# Patient Record
Sex: Female | Born: 1985 | Race: Black or African American | Hispanic: No | Marital: Married | Smoking: Current every day smoker
Health system: Southern US, Community
[De-identification: ages and names within clinical notes are randomized; demographics above are authoritative.]

## PROBLEM LIST (undated history)

## (undated) DIAGNOSIS — R0789 Other chest pain: Secondary | ICD-10-CM

## (undated) DIAGNOSIS — M545 Low back pain, unspecified: Secondary | ICD-10-CM

## (undated) DIAGNOSIS — F1210 Cannabis abuse, uncomplicated: Secondary | ICD-10-CM

## (undated) DIAGNOSIS — M5430 Sciatica, unspecified side: Secondary | ICD-10-CM

## (undated) DIAGNOSIS — E079 Disorder of thyroid, unspecified: Secondary | ICD-10-CM

## (undated) DIAGNOSIS — G8929 Other chronic pain: Secondary | ICD-10-CM

## (undated) DIAGNOSIS — A048 Other specified bacterial intestinal infections: Secondary | ICD-10-CM

## (undated) HISTORY — PX: HERNIA REPAIR: SHX51

## (undated) HISTORY — PX: THYROIDECTOMY, PARTIAL: SHX18

---

## 2016-03-23 ENCOUNTER — Emergency Department (HOSPITAL_BASED_OUTPATIENT_CLINIC_OR_DEPARTMENT_OTHER)
Admission: EM | Admit: 2016-03-23 | Discharge: 2016-03-23 | Disposition: A | Discharge status: Home / Self Care | Payer: Medicaid Other | Attending: Physician Assistant | Admitting: Physician Assistant

## 2016-03-23 ENCOUNTER — Encounter (HOSPITAL_BASED_OUTPATIENT_CLINIC_OR_DEPARTMENT_OTHER): Payer: Self-pay | Admitting: Emergency Medicine

## 2016-03-23 ENCOUNTER — Emergency Department (HOSPITAL_BASED_OUTPATIENT_CLINIC_OR_DEPARTMENT_OTHER): Payer: Medicaid Other

## 2016-03-23 DIAGNOSIS — R519 Headache, unspecified: Secondary | ICD-10-CM

## 2016-03-23 DIAGNOSIS — R51 Headache: Secondary | ICD-10-CM | POA: Diagnosis not present

## 2016-03-23 HISTORY — DX: Disorder of thyroid, unspecified: E07.9

## 2016-03-23 LAB — CBC WITH DIFFERENTIAL/PLATELET
BASOS ABS: 0 10*3/uL (ref 0.0–0.1)
BASOS PCT: 0 %
EOS ABS: 0 10*3/uL (ref 0.0–0.7)
EOS PCT: 0 %
HCT: 37.1 % (ref 36.0–46.0)
Hemoglobin: 12.9 g/dL (ref 12.0–15.0)
Lymphocytes Relative: 24 %
Lymphs Abs: 1.1 10*3/uL (ref 0.7–4.0)
MCH: 31.2 pg (ref 26.0–34.0)
MCHC: 34.8 g/dL (ref 30.0–36.0)
MCV: 89.8 fL (ref 78.0–100.0)
MONO ABS: 0.5 10*3/uL (ref 0.1–1.0)
Monocytes Relative: 11 %
Neutro Abs: 3.1 10*3/uL (ref 1.7–7.7)
Neutrophils Relative %: 65 %
PLATELETS: 159 10*3/uL (ref 150–400)
RBC: 4.13 MIL/uL (ref 3.87–5.11)
RDW: 12.4 % (ref 11.5–15.5)
WBC: 4.8 10*3/uL (ref 4.0–10.5)

## 2016-03-23 LAB — COMPREHENSIVE METABOLIC PANEL
ALBUMIN: 4.6 g/dL (ref 3.5–5.0)
ALK PHOS: 56 U/L (ref 38–126)
ALT: 15 U/L (ref 14–54)
ANION GAP: 9 (ref 5–15)
AST: 26 U/L (ref 15–41)
BILIRUBIN TOTAL: 0.6 mg/dL (ref 0.3–1.2)
BUN: 8 mg/dL (ref 6–20)
CALCIUM: 9.5 mg/dL (ref 8.9–10.3)
CO2: 26 mmol/L (ref 22–32)
CREATININE: 0.66 mg/dL (ref 0.44–1.00)
Chloride: 102 mmol/L (ref 101–111)
GFR calc Af Amer: >60 mL/min (ref >60)
GFR calc non Af Amer: >60 mL/min (ref >60)
GLUCOSE: 120 mg/dL — AB (ref 65–99)
Potassium: 3.2 mmol/L — ABNORMAL LOW (ref 3.5–5.1)
Sodium: 137 mmol/L (ref 135–145)
TOTAL PROTEIN: 8 g/dL (ref 6.5–8.1)

## 2016-03-23 LAB — PREGNANCY, URINE: Preg Test, Ur: NEGATIVE

## 2016-03-23 MED ORDER — PROCHLORPERAZINE EDISYLATE 5 MG/ML IJ SOLN
10.0000 mg | Freq: Once | INTRAMUSCULAR | Status: AC
  Administered 2016-03-23: 10 mg via INTRAVENOUS
  Filled 2016-03-23: qty 2

## 2016-03-23 MED ORDER — DIPHENHYDRAMINE HCL 50 MG/ML IJ SOLN
25.0000 mg | Freq: Once | INTRAMUSCULAR | Status: AC
  Administered 2016-03-23: 25 mg via INTRAVENOUS
  Filled 2016-03-23: qty 1

## 2016-03-23 MED ORDER — SODIUM CHLORIDE 0.9 % IV BOLUS (SEPSIS)
1000.0000 mL | Freq: Once | INTRAVENOUS | Status: AC
  Administered 2016-03-23: 1000 mL via INTRAVENOUS

## 2016-03-23 NOTE — ED Notes (Signed)
Pt told emt she had to leave,  rn went into room and pt was dressed and had removed iv,  Holding pressure at site, bleeding controlled,  rn applied dressing,  Pt stated pain was a 1 and thanked rn for the care she received  Would not wait to get vs

## 2016-03-23 NOTE — ED Notes (Signed)
Patient transported to CT 

## 2016-03-23 NOTE — ED Provider Notes (Signed)
[facility]   CSN: [id number] Arrival date & time: 03/23/16  1627     History   Chief Complaint Chief Complaint  Patient presents with  . Headache    HPI Kelli Bowman is a 30 y.o. female.  HPI   She is a 30 year old female presenting with headache. Patient's headache started around 1 PM today she ate. She said it started with a mild headache she went to sleep and she could not get rid of it. She said she took Tylenol and did not get better. She's got no neurologic symptoms. No nausea vomiting diarrhea photophobia. Has never had migraines in the past.  It sounds like it was not the most intense in the beginning but rather got increasingly intense. However it does feel worse than any headache she has had in the past.   Past Medical History:  Diagnosis Date  . Thyroid disease     There are no active problems to display for this patient.   No past surgical history on file.  OB History    No data available       Home Medications    Prior to Admission medications   Not on File    Family History No family history on file.  Social History Social History  Substance Use Topics  . Smoking status: Not on file  . Smokeless tobacco: Not on file  . Alcohol use Not on file     Allergies   Vicodin [hydrocodone-acetaminophen]   Review of Systems Review of Systems  Constitutional: Negative for fatigue and fever.  Gastrointestinal: Negative for nausea and vomiting.  Neurological: Positive for headaches.  All other systems reviewed and are negative.    Physical Exam Updated Vital Signs BP 156/82   Pulse 74   Temp 98.4 F (36.9 C) (Oral)   Resp 22   Ht 5\' 6"  (1.676 m)   Wt 105 lb 2 oz (47.7 kg)   LMP 03/02/2016   SpO2 100%   BMI 16.97 kg/m   Physical Exam  Constitutional: She is oriented to person, place, and time. She appears well-developed and well-nourished.  HENT:  Head: Normocephalic and atraumatic.  Eyes: Right eye  exhibits no discharge.  Cardiovascular: Normal rate.   Pulmonary/Chest: Effort normal.  Neurological: She is oriented to person, place, and time. No cranial nerve deficit.  Cranial nerves or motor. Patient withmoving all 4 extremitis.Marland Kitchen. Appears neurologically baseline. Pupils equal round responsive to light.  Skin: Skin is warm and dry. She is not diaphoretic.  Psychiatric: She has a normal mood and affect.  Nursing note and vitals reviewed.    ED Treatments / Results  Labs (all labs ordered are listed, but only abnormal results are displayed) Labs Reviewed  COMPREHENSIVE METABOLIC PANEL - Abnormal; Notable for the following:       Result Value   Potassium 3.2 (*)    Glucose, Bld 120 (*)    All other components within normal limits  CBC WITH DIFFERENTIAL/PLATELET  PREGNANCY, URINE    EKG  EKG Interpretation None       Radiology Ct Head Wo Contrast  Result Date: 03/23/2016 CLINICAL DATA:  Severe headaches EXAM: CT HEAD WITHOUT CONTRAST TECHNIQUE: Contiguous axial images were obtained from the base of the skull through the vertex without intravenous contrast. COMPARISON:  None. FINDINGS: Brain: No evidence of acute infarction, hemorrhage, hydrocephalus, extra-axial collection or mass lesion/mass effect. Vascular: No hyperdense vessel or unexpected calcification. Skull: Normal. Negative for fracture or focal  lesion. Sinuses/Orbits: No acute finding. Other: None. IMPRESSION: No acute abnormality noted. Electronically Signed   By: Alcide Clever M.D.   On: 03/23/2016 17:47    Procedures Procedures (including critical care time)  Medications Ordered in ED Medications  sodium chloride 0.9 % bolus 1,000 mL (0 mLs Intravenous Stopped 03/23/16 1755)  prochlorperazine (COMPAZINE) injection 10 mg (10 mg Intravenous Given 03/23/16 1713)  diphenhydrAMINE (BENADRYL) injection 25 mg (25 mg Intravenous Given 03/23/16 1716)     Initial Impression / Assessment and Plan / ED Course  I have  reviewed the triage vital signs and the nursing notes.  Pertinent labs & imaging results that were available during my care of the patient were reviewed by me and considered in my medical decision making (see chart for details).  Clinical Course   She is a healthy 30 year old \\presenting  with headache. Patient states that she does not usually get bad headaches. She took Tylenol and did not get better so she is here for help. We will get a CT. Given the fact that this headache started around 1 PM I think a CT noncontrast read by the radiologist will be sufficient to rule out subarachnoid hemorrhage. (Based on Perrry's data/publications) and ACEP guidelines for headache.  Will treat as migraine. Patient has no neurological deficit. Do not suspect cerebral venous  given that she has no risk factors, not on estrogens, no pregnancy.  CT negative.  Paitent felt imrpoved after treatment, then took out IV and left.   Final Clinical Impressions(s) / ED Diagnoses   Final diagnoses:  None    New Prescriptions There are no discharge medications for this patient.    Kelli Randall An, MD 03/23/16 [phone]

## 2016-03-23 NOTE — ED Triage Notes (Signed)
Onset of headache at 1300 today   Denies nausea opr vomiting

## 2016-03-23 NOTE — ED Notes (Signed)
Pt requesting something to eat. Pt instructed that all tests had to be evaluated before she could have anything.

## 2016-10-03 ENCOUNTER — Encounter (HOSPITAL_BASED_OUTPATIENT_CLINIC_OR_DEPARTMENT_OTHER): Payer: Self-pay | Admitting: *Deleted

## 2016-10-03 DIAGNOSIS — M542 Cervicalgia: Secondary | ICD-10-CM | POA: Diagnosis present

## 2016-10-03 DIAGNOSIS — R1013 Epigastric pain: Secondary | ICD-10-CM | POA: Diagnosis not present

## 2016-10-03 DIAGNOSIS — R197 Diarrhea, unspecified: Secondary | ICD-10-CM | POA: Diagnosis not present

## 2016-10-03 DIAGNOSIS — M62838 Other muscle spasm: Secondary | ICD-10-CM | POA: Diagnosis not present

## 2016-10-03 DIAGNOSIS — R1011 Right upper quadrant pain: Secondary | ICD-10-CM | POA: Diagnosis not present

## 2016-10-03 DIAGNOSIS — F17200 Nicotine dependence, unspecified, uncomplicated: Secondary | ICD-10-CM | POA: Insufficient documentation

## 2016-10-03 LAB — BASIC METABOLIC PANEL
Anion gap: 8 (ref 5–15)
BUN: 9 mg/dL (ref 6–20)
CALCIUM: 9 mg/dL (ref 8.9–10.3)
CO2: 29 mmol/L (ref 22–32)
CREATININE: 0.75 mg/dL (ref 0.44–1.00)
Chloride: 101 mmol/L (ref 101–111)
GFR calc Af Amer: >60 mL/min (ref >60)
Glucose, Bld: 89 mg/dL (ref 65–99)
Potassium: 3.3 mmol/L — ABNORMAL LOW (ref 3.5–5.1)
SODIUM: 138 mmol/L (ref 135–145)

## 2016-10-03 LAB — CBC WITH DIFFERENTIAL/PLATELET
Basophils Absolute: 0 10*3/uL (ref 0.0–0.1)
Basophils Relative: 0 %
EOS ABS: 0.1 10*3/uL (ref 0.0–0.7)
EOS PCT: 1 %
HCT: 35.8 % — ABNORMAL LOW (ref 36.0–46.0)
Hemoglobin: 11.9 g/dL — ABNORMAL LOW (ref 12.0–15.0)
LYMPHS PCT: 57 %
Lymphs Abs: 2.8 10*3/uL (ref 0.7–4.0)
MCH: 31.2 pg (ref 26.0–34.0)
MCHC: 33.2 g/dL (ref 30.0–36.0)
MCV: 94 fL (ref 78.0–100.0)
MONO ABS: 0.6 10*3/uL (ref 0.1–1.0)
Monocytes Relative: 12 %
Neutro Abs: 1.4 10*3/uL — ABNORMAL LOW (ref 1.7–7.7)
Neutrophils Relative %: 30 %
PLATELETS: 161 10*3/uL (ref 150–400)
RBC: 3.81 MIL/uL — AB (ref 3.87–5.11)
RDW: 12.6 % (ref 11.5–15.5)
WBC: 4.9 10*3/uL (ref 4.0–10.5)

## 2016-10-03 LAB — URINALYSIS, MICROSCOPIC (REFLEX)

## 2016-10-03 LAB — URINALYSIS, ROUTINE W REFLEX MICROSCOPIC
BILIRUBIN URINE: NEGATIVE
GLUCOSE, UA: NEGATIVE mg/dL
HGB URINE DIPSTICK: NEGATIVE
KETONES UR: NEGATIVE mg/dL
Nitrite: POSITIVE — AB
PH: 6 (ref 5.0–8.0)
Protein, ur: NEGATIVE mg/dL
Specific Gravity, Urine: 1.025 (ref 1.005–1.030)

## 2016-10-03 LAB — PREGNANCY, URINE: Preg Test, Ur: NEGATIVE

## 2016-10-03 LAB — I-STAT CG4 LACTIC ACID, ED: Lactic Acid, Venous: 0.91 mmol/L (ref 0.5–1.9)

## 2016-10-03 NOTE — ED Triage Notes (Signed)
Abdominal pain after she eats. Started 2 days ago. States she is having muscle spasms in the right side of her neck. Hurts with certain movements.

## 2016-10-04 ENCOUNTER — Emergency Department (HOSPITAL_BASED_OUTPATIENT_CLINIC_OR_DEPARTMENT_OTHER): Payer: Medicaid Other

## 2016-10-04 ENCOUNTER — Emergency Department (HOSPITAL_BASED_OUTPATIENT_CLINIC_OR_DEPARTMENT_OTHER)
Admission: EM | Admit: 2016-10-04 | Discharge: 2016-10-04 | Disposition: A | Discharge status: Home / Self Care | Payer: Medicaid Other | Attending: Emergency Medicine | Admitting: Emergency Medicine

## 2016-10-04 ENCOUNTER — Encounter (HOSPITAL_BASED_OUTPATIENT_CLINIC_OR_DEPARTMENT_OTHER): Payer: Self-pay

## 2016-10-04 ENCOUNTER — Emergency Department (HOSPITAL_BASED_OUTPATIENT_CLINIC_OR_DEPARTMENT_OTHER)
Admission: EM | Admit: 2016-10-04 | Discharge: 2016-10-04 | Disposition: A | Discharge status: Home / Self Care | Payer: Medicaid Other | Source: Home / Self Care | Attending: Emergency Medicine | Admitting: Emergency Medicine

## 2016-10-04 DIAGNOSIS — M62838 Other muscle spasm: Secondary | ICD-10-CM

## 2016-10-04 DIAGNOSIS — R109 Unspecified abdominal pain: Secondary | ICD-10-CM

## 2016-10-04 DIAGNOSIS — R52 Pain, unspecified: Secondary | ICD-10-CM

## 2016-10-04 LAB — LIPASE, BLOOD: Lipase: 18 U/L (ref 11–51)

## 2016-10-04 LAB — HEPATIC FUNCTION PANEL
ALK PHOS: 32 U/L — AB (ref 38–126)
ALT: 10 U/L — AB (ref 14–54)
AST: 21 U/L (ref 15–41)
Albumin: 4 g/dL (ref 3.5–5.0)
BILIRUBIN TOTAL: 0.4 mg/dL (ref 0.3–1.2)
Total Protein: 7.2 g/dL (ref 6.5–8.1)

## 2016-10-04 MED ORDER — METHOCARBAMOL 1000 MG/10ML IJ SOLN
1000.0000 mg | Freq: Once | INTRAVENOUS | Status: AC
  Administered 2016-10-04: 1000 mg via INTRAVENOUS
  Filled 2016-10-04: qty 10

## 2016-10-04 MED ORDER — METHOCARBAMOL 500 MG PO TABS: 500.0000 mg | ORAL_TABLET | Freq: Four times a day (QID) | ORAL | 0 refills | Status: DC | PRN

## 2016-10-04 MED ORDER — DICYCLOMINE HCL 20 MG PO TABS: 20.0000 mg | ORAL_TABLET | Freq: Two times a day (BID) | ORAL | 0 refills | Status: DC | PRN

## 2016-10-04 MED ORDER — SODIUM CHLORIDE 0.9 % IV BOLUS (SEPSIS)
1000.0000 mL | Freq: Once | INTRAVENOUS | Status: AC
  Administered 2016-10-04: 1000 mL via INTRAVENOUS

## 2016-10-04 MED ORDER — METHOCARBAMOL 1000 MG/10ML IJ SOLN
INTRAMUSCULAR | Status: AC
  Filled 2016-10-04: qty 10

## 2016-10-04 NOTE — ED Provider Notes (Signed)
[facility]   CSN: [id number] Arrival date & time: 10/04/16  4098     History   Chief Complaint Chief Complaint  Patient presents with  . Neck Spasms/Abd Cramp    HPI Kelli Bowman is a 31 y.o. female.  HPI   Pt p/w two complaints.  States that for the past 2-3 weeks she has had abdominal cramping after eating anything, and diarrhea after eating.  The pain lasts for 1-2 hours after eating.  It is cramping, causes her to feel hot and lightheaded.  Associated nausea without vomiting.  No blood in the diarrhea.  Denies fevers, urinary or vaginal symptoms, chest pain, SOB, cough.  She is lactose intolerant but states she does not eat mild products.  She also notes she has chronic back pain with muscle spasms and in the past week she has started having muscle spasms in her neck.  Denies any headache.  No heavy lifting or injuries.    Past Medical History:  Diagnosis Date  . Thyroid disease     There are no active problems to display for this patient.   History reviewed. No pertinent surgical history.  OB History    No data available       Home Medications    Prior to Admission medications   Medication Sig Start Date End Date Taking? Authorizing Provider  dicyclomine (BENTYL) 20 MG tablet Take 1 tablet (20 mg total) by mouth 2 (two) times daily as needed (abdominal cramping). 10/04/16   Trixie Dredge, PA-C  methocarbamol (ROBAXIN) 500 MG tablet Take 1-2 tablets (500-1,000 mg total) by mouth every 6 (six) hours as needed for muscle spasms (and pain). 10/04/16   Trixie Dredge, PA-C    Family History History reviewed. No pertinent family history.  Social History Social History  Substance Use Topics  . Smoking status: Current Every Day Smoker  . Smokeless tobacco: Never Used  . Alcohol use No     Allergies   Vicodin [hydrocodone-acetaminophen]   Review of Systems Review of Systems  All other systems reviewed and are  negative.    Physical Exam Updated Vital Signs BP (!) 124/102 (BP Location: Left Arm)   Pulse 67   Temp 99.2 F (37.3 C) (Oral)   Resp 16   Ht  (1.676 m)   Wt 49.9 kg   LMP 09/13/2016   SpO2 100%   BMI 17.75 kg/m   Physical Exam  Constitutional: She appears well-developed and well-nourished. No distress.  HENT:  Head: Normocephalic and atraumatic.  Neck: Neck supple.  Cardiovascular: Normal rate and regular rhythm.   Pulmonary/Chest: Effort normal and breath sounds normal. No respiratory distress. She has no wheezes. She has no rales.  Abdominal: Soft. She exhibits no distension. There is tenderness in the right upper quadrant and epigastric area. There is no rebound and no guarding.  Musculoskeletal:  Bilateral trapezius with tightness, tenderness.  Symmetric.    Neurological: She is alert.  Skin: She is not diaphoretic.  Nursing note and vitals reviewed.    ED Treatments / Results  Labs (all labs ordered are listed, but only abnormal results are displayed) Labs Reviewed  HEPATIC FUNCTION PANEL - Abnormal; Notable for the following:       Result Value   ALT 10 (*)    Alkaline Phosphatase 32 (*)    Bilirubin, Direct <0.1 (*)    All other components within normal limits  LIPASE, BLOOD    EKG  EKG Interpretation None  Radiology US Abdomen Limited Ruq  Result Date: 10/04/2016 CLINICAL DATA:  Diarrhea and lower abdominal pain for 2 weeks EXAM: US ABDOMEN LIMITED - RIGHT UPPER QUADRANT COMPARISON:  CT abdomen and pelvis 08/04/2014 FINDINGS: Gallbladder: Normally distended without stones or wall thickening. No pericholecystic fluid or sonographic Murphy sign. Common bile duct: Diameter: 1 mm diameter, normal Liver: Normal appearance No RIGHT upper quadrant free fluid IMPRESSION: Normal exam. Electronically Signed   By: Ulyses Southward M.D.   On: 10/04/2016 10:43    Procedures Procedures (including critical care time)  Medications Ordered in  ED Medications  sodium chloride 0.9 % bolus 1,000 mL (0 mLs Intravenous Stopped 10/04/16 1127)  methocarbamol (ROBAXIN) 1,000 mg in dextrose 5 % 50 mL IVPB (0 mg Intravenous Stopped 10/04/16 1127)     Initial Impression / Assessment and Plan / ED Course  I have reviewed the triage vital signs and the nursing notes.  Pertinent labs & imaging results that were available during my care of the patient were reviewed by me and considered in my medical decision making (see chart for details).    Afebrile, nontoxic patient with RUQ/epigastric pain and diarrhea.  Labs obtained last night when pt was initially triaged in the ED (then left, came back today) also reviewed.  All unremarkable.  RUQ [place] is negative.   D/C home with bentyl, robaxin, PCP follow up.  Discussed result, findings, treatment, and follow up  with patient.  Pt given return precautions.  Pt verbalizes understanding and agrees with plan.       Final Clinical Impressions(s) / ED Diagnoses   Final diagnoses:  Pain  Abdominal cramping  Muscle spasms of neck    New Prescriptions Discharge Medication List as of 10/04/2016 11:42 AM    START taking these medications   Details  dicyclomine (BENTYL) 20 MG tablet Take 1 tablet (20 mg total) by mouth 2 (two) times daily as needed (abdominal cramping)., Starting Tue 10/04/2016, Print    methocarbamol (ROBAXIN) 500 MG tablet Take 1-2 tablets (500-1,000 mg total) by mouth every 6 (six) hours as needed for muscle spasms (and pain)., Starting Tue 10/04/2016, Print         Brinson, PA-C 10/04/16 1728    Rolland Porter, MD 10/15/16 [phone]

## 2016-10-04 NOTE — ED Notes (Signed)
Patient transported to Ultrasound 

## 2016-10-04 NOTE — ED Notes (Signed)
Pt states she is cold and wants the fluid turned off.

## 2016-10-04 NOTE — ED Notes (Signed)
Called for room, no answer

## 2016-10-04 NOTE — Discharge Instructions (Signed)
Read the information below.  Use the prescribed medication as directed.  Please discuss all new medications with your pharmacist.  You may return to the Emergency Department at any time for worsening condition or any new symptoms that concern you.   If you develop high fevers, worsening abdominal pain, uncontrolled vomiting, or are unable to tolerate fluids by mouth, return to the ER for a recheck.  ° °

## 2016-10-04 NOTE — ED Notes (Signed)
No answer for call for room

## 2016-10-04 NOTE — ED Triage Notes (Addendum)
Pt reports muscle spasms in her neck x1 week and abdominal cramping after eating with diarrhea x2weeks. States LWBS last night.

## 2017-03-30 ENCOUNTER — Encounter (HOSPITAL_BASED_OUTPATIENT_CLINIC_OR_DEPARTMENT_OTHER): Payer: Self-pay | Admitting: *Deleted

## 2017-03-30 ENCOUNTER — Emergency Department (HOSPITAL_BASED_OUTPATIENT_CLINIC_OR_DEPARTMENT_OTHER)
Admission: EM | Admit: 2017-03-30 | Discharge: 2017-03-30 | Disposition: A | Discharge status: Home / Self Care | Payer: Medicaid Other | Attending: Emergency Medicine | Admitting: Emergency Medicine

## 2017-03-30 DIAGNOSIS — K0889 Other specified disorders of teeth and supporting structures: Secondary | ICD-10-CM | POA: Diagnosis present

## 2017-03-30 DIAGNOSIS — K047 Periapical abscess without sinus: Secondary | ICD-10-CM | POA: Insufficient documentation

## 2017-03-30 DIAGNOSIS — F17200 Nicotine dependence, unspecified, uncomplicated: Secondary | ICD-10-CM | POA: Insufficient documentation

## 2017-03-30 LAB — RAPID STREP SCREEN (MED CTR MEBANE ONLY): Streptococcus, Group A Screen (Direct): NEGATIVE

## 2017-03-30 MED ORDER — BUPIVACAINE-EPINEPHRINE (PF) 0.5% -1:200000 IJ SOLN
INTRAMUSCULAR | Status: AC
  Filled 2017-03-30: qty 1.8

## 2017-03-30 MED ORDER — ONDANSETRON HCL 4 MG/2ML IJ SOLN: 4.0000 mg | Freq: Once | INTRAMUSCULAR | Status: DC

## 2017-03-30 MED ORDER — LIDOCAINE VISCOUS 2 % MT SOLN: 15.0000 mL | OROMUCOSAL | 0 refills | Status: DC | PRN

## 2017-03-30 MED ORDER — ONDANSETRON 4 MG PO TBDP
ORAL_TABLET | ORAL | Status: AC
  Administered 2017-03-30: 4 mg
  Filled 2017-03-30: qty 1

## 2017-03-30 MED ORDER — PENICILLIN V POTASSIUM 500 MG PO TABS: 500.0000 mg | ORAL_TABLET | Freq: Four times a day (QID) | ORAL | 0 refills | Status: AC

## 2017-03-30 MED ORDER — BUPIVACAINE HCL (PF) 0.5 % IJ SOLN: 2.0000 mL | Freq: Once | INTRAMUSCULAR | Status: DC

## 2017-03-30 MED ORDER — OXYCODONE-ACETAMINOPHEN 5-325 MG PO TABS
1.0000 | ORAL_TABLET | ORAL | Status: DC | PRN
  Administered 2017-03-30: 1 via ORAL
  Filled 2017-03-30: qty 1

## 2017-03-30 MED ORDER — TRAMADOL HCL 50 MG PO TABS: 50.0000 mg | ORAL_TABLET | Freq: Four times a day (QID) | ORAL | 0 refills | Status: DC | PRN

## 2017-03-30 NOTE — ED Notes (Signed)
ED Provider at bedside. 

## 2017-03-30 NOTE — ED Provider Notes (Signed)
[facility] EMERGENCY DEPARTMENT Provider Note   CSN: [id number] Arrival date & time: 03/30/17  1928     History   Chief Complaint Chief Complaint  Patient presents with  . Dental Pain  . Sore Throat    HPI Kelli Bowman is a 31 y.o. female with a history of thyroid disease who presents to the emergency department today for right upper dental pain x 3 days. Pain has been constant and described as pressure. She notes  small lump on her upper gum line where the area pain is. Patient has been controlled with Excedrin migraine until today when the pain returned from pressure to burning sensation. She also notes that she started having sore throat today. This is now relieved after she was given pain medication in triage. She denies fever, chills, inability to control secretions, N/V, abdominal pain, cough, congestion, voice change, or trauma. She notes poor dentition. She does not have a dentist and does not follow up with one regularly.    HPI  Past Medical History:  Diagnosis Date  . Thyroid disease     There are no active problems to display for this patient.   History reviewed. No pertinent surgical history.  OB History    No data available       Home Medications    Prior to Admission medications   Not on File    Family History History reviewed. No pertinent family history.  Social History Social History  Substance Use Topics  . Smoking status: Current Every Day Smoker  . Smokeless tobacco: Never Used  . Alcohol use No     Allergies   Vicodin [hydrocodone-acetaminophen]   Review of Systems Review of Systems  Constitutional: Negative for chills and fever.  HENT: Positive for dental problem and sore throat. Negative for drooling, rhinorrhea, trouble swallowing and voice change.   Respiratory: Negative for cough, chest tightness and shortness of breath.   Cardiovascular: Negative for palpitations.  Gastrointestinal: Negative for abdominal  pain, nausea and vomiting.     Physical Exam Updated Vital Signs BP (!) 138/98 (BP Location: Left Arm)   Pulse 83   Temp 98.6 F (37 C) (Oral)   Resp (!) 24 Comment: crying  LMP 03/01/2017   SpO2 100%   Physical Exam  Constitutional: She appears well-developed and well-nourished. No distress.  HENT:  Head: Normocephalic and atraumatic.  Right Ear: Hearing, tympanic membrane, external ear and ear canal normal. No foreign bodies. Tympanic membrane is not injected, not perforated, not erythematous, not retracted and not bulging.  Left Ear: Hearing, tympanic membrane, external ear and ear canal normal. No foreign bodies. Tympanic membrane is not injected, not perforated, not erythematous, not retracted and not bulging.  Nose: No mucosal edema, rhinorrhea, sinus tenderness, septal deviation or nasal septal hematoma.  No foreign bodies. Right sinus exhibits maxillary sinus tenderness. Right sinus exhibits no frontal sinus tenderness. Left sinus exhibits no maxillary sinus tenderness and no frontal sinus tenderness.  The patient has normal phonation and is in control of secretions. No stridor.  Midline uvula without edema. Soft palate rises symmetrically. No tonsillar erythema or exudates. No PTA. Tongue protrusion is normal. No trismus. No creptius on neck palpation. No neck swelling. No LAD. Noted gingival erythema and fluctuance of right upper, outer,  gumline, measuring 1 cm x 1cm. noted. No drainage. No floor edema. No facial swelling. Mucus membranes moist.  Eyes: Pupils are equal, round, and reactive to light. Conjunctivae, EOM and lids are normal. Right  eye exhibits no discharge. Left eye exhibits no discharge. Right conjunctiva is not injected. Left conjunctiva is not injected. No scleral icterus.  Neck: Trachea normal, normal range of motion, full passive range of motion without pain and phonation normal. Neck supple. No spinous process tenderness and no muscular tenderness present. No neck  rigidity. No tracheal deviation and normal range of motion present.  No meningismus  Cardiovascular: Normal rate and regular rhythm.   Pulmonary/Chest: Effort normal and breath sounds normal. No stridor. No respiratory distress. She has no wheezes.  Abdominal: Soft.  Lymphadenopathy:       Head (right side): No submental, no submandibular, no tonsillar, no preauricular, no posterior auricular and no occipital adenopathy present.       Head (left side): No submental, no submandibular, no tonsillar, no preauricular, no posterior auricular and no occipital adenopathy present.    She has no cervical adenopathy.  Neurological: She is alert.  Skin: Skin is warm and dry. No rash noted. No pallor.  Psychiatric: She has a normal mood and affect.  Nursing note and vitals reviewed.    ED Treatments / Results  Labs (all labs ordered are listed, but only abnormal results are displayed) Labs Reviewed  RAPID STREP SCREEN (NOT AT [facility])  CULTURE, GROUP A STREP [facility])    EKG  EKG Interpretation None       Radiology No results found.  Procedures Procedures (including critical care time)  Medications Ordered in ED Medications  oxyCODONE-acetaminophen (PERCOCET/ROXICET) 5-325 MG per tablet 1 tablet (1 tablet Oral Given 03/30/17 1947)  ondansetron (ZOFRAN) injection 4 mg (4 mg Intravenous Not Given 03/30/17 1948)  ondansetron (ZOFRAN-ODT) 4 MG disintegrating tablet (4 mg  Given 03/30/17 1947)     Initial Impression / Assessment and Plan / ED Course  I have reviewed the triage vital signs and the nursing notes.  Pertinent labs & imaging results that were available during my care of the patient were reviewed by me and considered in my medical decision making (see chart for details).     Patient with dental abscess. Exam unconcerning for Ludwig's angina or spread of infection.  Attempted I&D but patient cannot tolerate. Wishes for referall to dentist. Will provide resource. Will treat with  penicillin and anti-inflammatories medicine. Short course of pain medication given.  Patient [place] in [place] Controlled Sub[place]. Urged patient to follow-up with dentist.  Return precautions discussed. Patient in agreement with plan and appears safe for d/c. Patient case discussed with Dr. Rush Landmarkegeler who is in agreement with plan.  Final Clinical Impressions(s) / ED Diagnoses   Final diagnoses:  Dental abscess    New Prescriptions New Prescriptions   No medications on file     Princella PellegriniMaczis, Andres Escandon M, PA-C 03/31/17 0023    Tegeler, Canary Brimhristopher J, MD 03/31/17 1248

## 2017-03-30 NOTE — Discharge Instructions (Signed)
Please read and follow all provided instructions.  Your diagnoses today include:  1. Dental abscess     The exam and treatment you received today has been provided on an emergency basis only. This is not a substitute for complete medical or dental care. This problem will not resolve on its own without the care of a dentist.  You were found to have a dental abscess. We tried to drain this in the department but the procedure was not tolerated. Please follow up with a dental for treatment of your condition.   Tests performed today include: 1. Vital signs. See below for your results today.   Medications prescribed:   Take any prescribed medications only as directed. Use ibuprofen or naproxen for pain. Use the viscous lidocaine for mouth pain. Swish with the lidocaine and spit it out. Do not swallow it.  For breakthrough pain you may take . Do not drink alcohol drive or operate heavy machinery when taking. You are being provided a prescription for opiates (also known as narcotics) for pain control on an as needed basis.  Opiates can be addictive and should only be used when absolutely necessary for pain control when other alternatives do not work.  We recommend you only use them for the recommended amount of time and only as prescribed.  Please do not take with other sedative medications or alcohol.  Please do not drive, operate machinery, or make important decisions while taking opiates.  Please note that these medications can be addictive and have high abuse potential.  Please keep these medications locked away from children, teenagers or any family members with history of substance abuse. Additionally, these medications may cause constipation - take over the counter stool softeners or add fiber to your diet to treat this (Metamucil, Psyllium Fiber, Colace, Miralax) Further refills will need to be obtained from your primary care doctor and will not be prescribed through the Emergency Department. You  will test positive on most drug tests while taking this medication.   Please take all of your antibiotics until finished!   You may develop abdominal discomfort or diarrhea from the antibiotic.  You may help offset this with probiotics which you can buy or get in yogurt. Do not eat or take the probiotics until 2 hours after your antibiotic. Do not take your medicine if develop an itchy rash, swelling in your mouth or lips, or difficulty breathing.   Home care instructions:  Follow any educational materials contained in this packet.  Follow-up instructions: Please follow-up with your dentist for further evaluation of your symptoms.   Please Contact this number: [phone] to get into contact with an emergency dental service to schedule an appointment with a local dentist.   Dental Assistance: See below for dental referrals  Return instructions:  1. Please return to the Emergency Department if you experience worsening symptoms. 2. Please return if you develop a fever, you develop more swelling in your face or neck, you have trouble breathing or swallowing food. 3. Please return if you have any other emergent concerns.  Additional Information:  Your vital signs today were: BP (!) 138/98 (BP Location: Left Arm)    Pulse 83    Temp 98.6 F (37 C) (Oral)    Resp (!) 24 Comment: crying   LMP 03/01/2017    SpO2 100%  If your blood pressure (BP) was elevated above 135/85 this visit, please have this repeated by your doctor within one month. --------------

## 2017-03-30 NOTE — ED Triage Notes (Signed)
Pt with right upper dental pain x 3 days began having throat pain and burning this am.

## 2017-04-02 LAB — CULTURE, GROUP A STREP (THRC)

## 2017-08-23 ENCOUNTER — Emergency Department (HOSPITAL_BASED_OUTPATIENT_CLINIC_OR_DEPARTMENT_OTHER)
Admission: EM | Admit: 2017-08-23 | Discharge: 2017-08-23 | Disposition: A | Discharge status: Home / Self Care | Payer: Medicaid Other | Attending: Emergency Medicine | Admitting: Emergency Medicine

## 2017-08-23 ENCOUNTER — Encounter (HOSPITAL_BASED_OUTPATIENT_CLINIC_OR_DEPARTMENT_OTHER): Payer: Self-pay | Admitting: Emergency Medicine

## 2017-08-23 DIAGNOSIS — M62838 Other muscle spasm: Secondary | ICD-10-CM | POA: Diagnosis not present

## 2017-08-23 DIAGNOSIS — F17210 Nicotine dependence, cigarettes, uncomplicated: Secondary | ICD-10-CM | POA: Insufficient documentation

## 2017-08-23 DIAGNOSIS — M542 Cervicalgia: Secondary | ICD-10-CM | POA: Diagnosis present

## 2017-08-23 DIAGNOSIS — E0590 Thyrotoxicosis, unspecified without thyrotoxic crisis or storm: Secondary | ICD-10-CM | POA: Diagnosis not present

## 2017-08-23 MED ORDER — METHOCARBAMOL 500 MG PO TABS: 500.0000 mg | ORAL_TABLET | Freq: Three times a day (TID) | ORAL | 0 refills | Status: DC | PRN

## 2017-08-23 MED ORDER — NAPROXEN 500 MG PO TABS: 500.0000 mg | ORAL_TABLET | Freq: Two times a day (BID) | ORAL | 0 refills | Status: DC

## 2017-08-23 NOTE — ED Provider Notes (Signed)
[facility] EMERGENCY DEPARTMENT Provider Note   CSN: [id number] Arrival date & time: 08/23/17  1448     History   Chief Complaint Chief Complaint  Patient presents with  . Spasms    HPI Kelli Bowman is a 32 y.o. female history of tobacco abuse and hyperthyroidism who presents the emergency department complaining of "muscle spasms" that have been occurring intermittently for the past 3 days.  Patient states she is having intermittent muscle spasm/cramping pain to her bilateral neck muscles, mid to lower back muscles, as well as her legs.  She states the problem she has experienced in the past which has improved with muscle relaxers.  States this feels exactly the same.  Rates her current discomfort an 8 out of 10 in severity.  States this is worse with movement, better with rest.  No injury or change in activity.  Denies numbness, weakness, fever, chills, hematuria/dark urine, or any other concerns at this time. Denies incontinence to bowel/bladder, fever, chills, IV drug use, or hx of cancer.   HPI  Past Medical History:  Diagnosis Date  . Thyroid disease     There are no active problems to display for this patient.   History reviewed. No pertinent surgical history.  OB History    No data available       Home Medications    Prior to Admission medications   Medication Sig Start Date End Date Taking? Authorizing Provider  lidocaine (XYLOCAINE) 2 % solution Use as directed 15 mLs in the mouth or throat as needed for mouth pain. 03/30/17   Maczis, Elmer Sow, PA-C  traMADol (ULTRAM) 50 MG tablet Take 1 tablet (50 mg total) by mouth every 6 (six) hours as needed. 03/30/17   Maczis, Elmer Sow, PA-C    Family History History reviewed. No pertinent family history.  Social History Social History   Tobacco Use  . Smoking status: Current Every Day Smoker  . Smokeless tobacco: Never Used  Substance Use Topics  . Alcohol use: No  . Drug use: No      Allergies   Vicodin [hydrocodone-acetaminophen]   Review of Systems Review of Systems  Constitutional: Negative for chills and fever.  Respiratory: Negative for shortness of breath.   Cardiovascular: Negative for chest pain.  Genitourinary: Negative for hematuria.  Musculoskeletal: Positive for back pain, myalgias and neck pain.  Neurological: Negative for weakness and numbness.       Negative for incontinence to bowel or bladder.  Negative for saddle anesthesia.   Physical Exam Updated Vital Signs BP 113/78 (BP Location: Left Arm)   Pulse 70   Temp 98.7 F (37.1 C) (Oral)   Resp 16   Ht 5\' 6"  (1.676 m)   Wt 48.1 kg (106 lb 0.7 oz)   SpO2 100%   BMI 17.12 kg/m   Physical Exam  Constitutional: She appears well-developed and well-nourished.  Non-toxic appearance. No distress.  HENT:  Head: Normocephalic and atraumatic.  Eyes: Conjunctivae are normal. Right eye exhibits no discharge. Left eye exhibits no discharge.  Neck: Normal range of motion. Neck supple. Muscular tenderness (bilaterally with palpable spasm) present. No spinous process tenderness present.  Cardiovascular: Normal rate and regular rhythm.  No murmur heard. Pulses:      Radial pulses are 2+ on the right side, and 2+ on the left side.       Posterior tibial pulses are 2+ on the right side, and 2+ on the left side.  Pulmonary/Chest: Breath sounds normal.  No respiratory distress. She has no wheezes. She has no rales.  Abdominal: Soft. She exhibits no distension. There is no tenderness.  Musculoskeletal:  No obvious deformity, appreciable swelling, erythema, or ecchymosis. Back: No midline tenderness or paraspinal muscle tenderness. Lower extremities: No edema.  Nontender.  Normal range of motion.  Neurological: She is alert.  Clear speech.  Patient has 5 out of 5 grip strength bilaterally.  5 out of 5 strength with plantar and dorsiflexion bilaterally.  Sensation grossly intact bilateral upper and lower  extremities.  Patellar DTRs are 2+ and symmetric.  Gait is steady and intact.  Skin: Skin is warm and dry. No rash noted.  Psychiatric: She has a normal mood and affect. Her behavior is normal.  Nursing note and vitals reviewed.   ED Treatments / Results  Labs (all labs ordered are listed, but only abnormal results are displayed) Labs Reviewed - No data to display  EKG  EKG Interpretation None       Radiology No results found.  Procedures Procedures (including critical care time)  Medications Ordered in ED Medications - No data to display   Initial Impression / Assessment and Plan / ED Course  I have reviewed the triage vital signs and the nursing notes.  Pertinent labs & imaging results that were available during my care of the patient were reviewed by me and considered in my medical decision making (see chart for details).   Patient presents with complaint of "muscle spasms".  Patient is nontoxic-appearing, no apparent distress, vitals WNL.  Patient with palpable muscle spasm to neck muscles bilaterally.  Otherwise nontender. Patient presents with complaint of back pain.  She is without neurologic deficits or point vertebral tenderness. She is able to ambulate.  No loss of bowel or bladder control. No saddle anesthesia.  No concern for cauda equina.  No fever, night sweats, weight loss, h/o cancer, or IVDU.  No hematuria/darkened urine or inciting event/activity, doubt rhabdomyolysis.  Given patient has history of similar which has improved with muscle relaxers will treat with Naproxen and Robaxin with PCP follow up, discussed with patient that they are not to drive or operate heavy machinery while taking Robaxin. I discussed treatment plan, need for PCP follow-up, and return precautions with the patient. Provided opportunity for questions, patient confirmed understanding and is in agreement with plan.   Final Clinical Impressions(s) / ED Diagnoses   Final diagnoses:  Muscle  spasm    ED Discharge Orders        Ordered    naproxen (NAPROSYN) 500 MG tablet  2 times daily     08/23/17 1738    methocarbamol (ROBAXIN) 500 MG tablet  Every 8 hours PRN     08/23/17 1738       Gurley Climer, Pleas KochSamantha R, PA-C 08/23/17 1756    Loren RacerYelverton, David, MD 08/26/17 1321

## 2017-08-23 NOTE — Discharge Instructions (Signed)
You were seen in the emergency department today for muscle spasms.  We have given you 2 prescriptions today:  - Naproxen is a nonsteroidal anti-inflammatory medication that will help with pain and swelling. Be sure to take this medication as prescribed with food, 1 pill every 12 hours,  It should be taken with food, as it can cause stomach upset, and more seriously, stomach bleeding. Do not take other nonsteroidal anti-inflammatory medications with this such as Advil, Motrin, or Aleve.   - Robaxin is the muscle relaxer I have prescribed, this is meant to help with muscle tightness. Be aware that this medication may make you drowsy therefore the first time you take this it should be at a time you are in an environment where you can rest. Do not drive or operate heavy machinery when taking this medication.     Be sure to remain well-hydrated. Follow-up with your OB/GYN provider as scheduled this week as well as subsequently your endocrinologist.  Follow-up with your primary care provider for reevaluation in 1 week if your symptoms have not improved.  Return to the emergency department for new or worsening symptoms including but not limited to worsening pain, darkened urine, weakness, or any other concerns that she may have.

## 2017-08-23 NOTE — ED Notes (Signed)
ED Provider at bedside. 

## 2017-08-23 NOTE — ED Triage Notes (Signed)
Muscle spasmas and cramping in legs that started  On Monday ,  Some nausea , took 4 total tylenal yesterday but none today  , states has   thryoid issues

## 2017-09-22 ENCOUNTER — Emergency Department (HOSPITAL_BASED_OUTPATIENT_CLINIC_OR_DEPARTMENT_OTHER)
Admission: EM | Admit: 2017-09-22 | Discharge: 2017-09-22 | Disposition: A | Discharge status: Home / Self Care | Payer: Medicaid Other | Attending: Emergency Medicine | Admitting: Emergency Medicine

## 2017-09-22 ENCOUNTER — Other Ambulatory Visit: Payer: Self-pay

## 2017-09-22 ENCOUNTER — Encounter (HOSPITAL_BASED_OUTPATIENT_CLINIC_OR_DEPARTMENT_OTHER): Payer: Self-pay | Admitting: *Deleted

## 2017-09-22 DIAGNOSIS — R110 Nausea: Secondary | ICD-10-CM | POA: Insufficient documentation

## 2017-09-22 DIAGNOSIS — R197 Diarrhea, unspecified: Secondary | ICD-10-CM | POA: Insufficient documentation

## 2017-09-22 DIAGNOSIS — Z5321 Procedure and treatment not carried out due to patient leaving prior to being seen by health care provider: Secondary | ICD-10-CM | POA: Insufficient documentation

## 2017-09-22 LAB — URINALYSIS, MICROSCOPIC (REFLEX)

## 2017-09-22 LAB — URINALYSIS, ROUTINE W REFLEX MICROSCOPIC
BILIRUBIN URINE: NEGATIVE
GLUCOSE, UA: NEGATIVE mg/dL
HGB URINE DIPSTICK: NEGATIVE
Ketones, ur: NEGATIVE mg/dL
Nitrite: NEGATIVE
PH: 6 (ref 5.0–8.0)
Protein, ur: NEGATIVE mg/dL

## 2017-09-22 LAB — PREGNANCY, URINE: PREG TEST UR: NEGATIVE

## 2017-09-22 NOTE — ED Notes (Signed)
Pt was seen leaving ED by reg clerk-is not in ED WR

## 2017-09-22 NOTE — ED Triage Notes (Signed)
Pt c/o n/d x 4 days only after eating

## 2018-02-13 ENCOUNTER — Encounter (HOSPITAL_BASED_OUTPATIENT_CLINIC_OR_DEPARTMENT_OTHER): Payer: Self-pay

## 2018-02-13 ENCOUNTER — Emergency Department (HOSPITAL_BASED_OUTPATIENT_CLINIC_OR_DEPARTMENT_OTHER)
Admission: EM | Admit: 2018-02-13 | Discharge: 2018-02-13 | Disposition: A | Discharge status: Home / Self Care | Payer: Medicaid Other | Attending: Emergency Medicine | Admitting: Emergency Medicine

## 2018-02-13 ENCOUNTER — Other Ambulatory Visit: Payer: Self-pay

## 2018-02-13 ENCOUNTER — Emergency Department (HOSPITAL_BASED_OUTPATIENT_CLINIC_OR_DEPARTMENT_OTHER): Payer: Medicaid Other

## 2018-02-13 DIAGNOSIS — K5900 Constipation, unspecified: Secondary | ICD-10-CM

## 2018-02-13 DIAGNOSIS — F17200 Nicotine dependence, unspecified, uncomplicated: Secondary | ICD-10-CM | POA: Diagnosis not present

## 2018-02-13 DIAGNOSIS — Z79899 Other long term (current) drug therapy: Secondary | ICD-10-CM | POA: Diagnosis not present

## 2018-02-13 LAB — CBC WITH DIFFERENTIAL/PLATELET
Basophils Absolute: 0 10*3/uL (ref 0.0–0.1)
Basophils Relative: 0 %
EOS PCT: 1 %
Eosinophils Absolute: 0 10*3/uL (ref 0.0–0.7)
HEMATOCRIT: 32.1 % — AB (ref 36.0–46.0)
Hemoglobin: 10.5 g/dL — ABNORMAL LOW (ref 12.0–15.0)
Lymphocytes Relative: 25 %
Lymphs Abs: 1.5 10*3/uL (ref 0.7–4.0)
MCH: 28.8 pg (ref 26.0–34.0)
MCHC: 32.7 g/dL (ref 30.0–36.0)
MCV: 88.2 fL (ref 78.0–100.0)
Monocytes Absolute: 0.7 10*3/uL (ref 0.1–1.0)
Monocytes Relative: 12 %
NEUTROS ABS: 3.8 10*3/uL (ref 1.7–7.7)
Neutrophils Relative %: 62 %
PLATELETS: 213 10*3/uL (ref 150–400)
RBC: 3.64 MIL/uL — ABNORMAL LOW (ref 3.87–5.11)
RDW: 14.4 % (ref 11.5–15.5)
WBC: 6.1 10*3/uL (ref 4.0–10.5)

## 2018-02-13 LAB — URINALYSIS, ROUTINE W REFLEX MICROSCOPIC
Bilirubin Urine: NEGATIVE
GLUCOSE, UA: NEGATIVE mg/dL
HGB URINE DIPSTICK: NEGATIVE
KETONES UR: NEGATIVE mg/dL
Nitrite: NEGATIVE
PROTEIN: NEGATIVE mg/dL
Specific Gravity, Urine: 1.025 (ref 1.005–1.030)
pH: 6 (ref 5.0–8.0)

## 2018-02-13 LAB — COMPREHENSIVE METABOLIC PANEL
ALT: 12 U/L (ref 0–44)
AST: 23 U/L (ref 15–41)
Albumin: 3.8 g/dL (ref 3.5–5.0)
Alkaline Phosphatase: 37 U/L — ABNORMAL LOW (ref 38–126)
Anion gap: 9 (ref 5–15)
BILIRUBIN TOTAL: 0.4 mg/dL (ref 0.3–1.2)
BUN: 10 mg/dL (ref 6–20)
CHLORIDE: 104 mmol/L (ref 98–111)
CO2: 25 mmol/L (ref 22–32)
Calcium: 8 mg/dL — ABNORMAL LOW (ref 8.9–10.3)
Creatinine, Ser: 0.56 mg/dL (ref 0.44–1.00)
Glucose, Bld: 94 mg/dL (ref 70–99)
POTASSIUM: 3.6 mmol/L (ref 3.5–5.1)
Sodium: 138 mmol/L (ref 135–145)
TOTAL PROTEIN: 6.9 g/dL (ref 6.5–8.1)

## 2018-02-13 LAB — URINALYSIS, MICROSCOPIC (REFLEX)

## 2018-02-13 LAB — LIPASE, BLOOD: LIPASE: 24 U/L (ref 11–51)

## 2018-02-13 LAB — PREGNANCY, URINE: PREG TEST UR: NEGATIVE

## 2018-02-13 MED ORDER — KETOROLAC TROMETHAMINE 15 MG/ML IJ SOLN
15.0000 mg | Freq: Once | INTRAMUSCULAR | Status: AC
  Administered 2018-02-13: 15 mg via INTRAVENOUS
  Filled 2018-02-13: qty 1

## 2018-02-13 MED ORDER — SODIUM CHLORIDE 0.9 % IV BOLUS
500.0000 mL | Freq: Once | INTRAVENOUS | Status: AC
  Administered 2018-02-13: 500 mL via INTRAVENOUS

## 2018-02-13 MED ORDER — MAGNESIUM CITRATE PO SOLN: 1.0000 | Freq: Once | ORAL | 0 refills | Status: AC

## 2018-02-13 NOTE — ED Provider Notes (Signed)
[facility] EMERGENCY DEPARTMENT Provider Note   CSN: [id number] Arrival date & time: 02/13/18  1736     History   Chief Complaint Chief Complaint  Patient presents with  . Constipation    HPI Kelli Bowman is a 32 y.o. female presenting for evaluation of constipation and abdominal pain.   Patient states that she has not had a bowel movement in 2 to 3 days.  When she tries have a bowel movement, she has to strain very hard.  This is abnormal for her, although she normally does not have a bowel movement every day.  Patient states she took a capful of MiraLAX and another medication, which she thinks was milk of magnesia, earlier today which encouraged a small bowel movement, but no complete evacuation.  She reports intermittent abdominal pain, usually of the lower abdomen.  She is having no pain currently.  She denies fevers, chills, nausea, or vomiting.  She reports urinary frequency without dysuria or hematuria.  She was evaluated at urgent care several days ago, diagnosed with a UTI but has not started the antibiotics.  Patient reports she is tolerating p.o., no postprandial nausea or vomiting.  No postprandial pain.  She does have a history of chronic abdominal pain.  She has had a ventral hernia repair, and is told that she has another ventral hernia.  This has been evaluated by surgery, and was told that she does not need surgery at this time.  Patient has an appointment with GI for an EGD and colonoscopy next week.  Patient reports a history of hypothyroidism for which she takes medication.  Her last several lab values have been normal, no change in her medication recently.  She denies other medical problems.  HPI  Past Medical History:  Diagnosis Date  . Thyroid disease     There are no active problems to display for this patient.   Past Surgical History:  Procedure Laterality Date  . CESAREAN SECTION    . HERNIA REPAIR       OB History   None      Home  Medications    Prior to Admission medications   Medication Sig Start Date End Date Taking? Authorizing Provider  levothyroxine (SYNTHROID, LEVOTHROID) 125 MCG tablet Take 125 mcg by mouth daily before breakfast.   Yes [provider]  lidocaine (XYLOCAINE) 2 % solution Use as directed 15 mLs in the mouth or throat as needed for mouth pain. 03/30/17   Maczis, Elmer Sow, PA-C  methocarbamol (ROBAXIN) 500 MG tablet Take 1 tablet (500 mg total) by mouth every 8 (eight) hours as needed for muscle spasms. 08/23/17   Petrucelli, Samantha R, PA-C  naproxen (NAPROSYN) 500 MG tablet Take 1 tablet (500 mg total) by mouth 2 (two) times daily. 08/23/17   Petrucelli, Samantha R, PA-C  traMADol (ULTRAM) 50 MG tablet Take 1 tablet (50 mg total) by mouth every 6 (six) hours as needed. 03/30/17   Maczis, Elmer Sow, PA-C    Family History No family history on file.  Social History Social History   Tobacco Use  . Smoking status: Current Every Day Smoker    Packs/day: 0.50  . Smokeless tobacco: Never Used  Substance Use Topics  . Alcohol use: No  . Drug use: No     Allergies   Vicodin [hydrocodone-acetaminophen]   Review of Systems Review of Systems  Gastrointestinal: Positive for abdominal pain (not currently) and constipation.  Genitourinary: Positive for frequency.  All other systems  reviewed and are negative.    Physical Exam Updated Vital Signs BP 121/81 (BP Location: Right Arm)   Pulse 68   Temp 98.4 F (36.9 C) (Oral)   Resp 16   Ht 5\' 6"  (1.676 m)   Wt 48.7 kg   SpO2 100%   BMI 17.33 kg/m   Physical Exam  Constitutional: She is oriented to person, place, and time. She appears well-developed and well-nourished. No distress.  Resting comfortably in the bed in no acute distress  HENT:  Head: Normocephalic and atraumatic.  Eyes: Pupils are equal, round, and reactive to light. Conjunctivae and EOM are normal.  Neck: Normal range of motion. Neck supple.  Cardiovascular:  Normal rate, regular rhythm and intact distal pulses.  Pulmonary/Chest: Effort normal and breath sounds normal. No respiratory distress. She has no wheezes.  Abdominal: Soft. She exhibits no distension and no mass. There is tenderness. There is no rebound and no guarding. No hernia.  Generalized tenderness palpation of the abdomen without rigidity, guarding or distention.  Negative rebound.  No signs of peritonitis.  No obvious hernia palpated  Musculoskeletal: Normal range of motion.  Neurological: She is alert and oriented to person, place, and time.  Skin: Skin is warm and dry. Capillary refill takes less than 2 seconds.  Psychiatric: She has a normal mood and affect.  Nursing note and vitals reviewed.    ED Treatments / Results  Labs (all labs ordered are listed, but only abnormal results are displayed) Labs Reviewed  CBC WITH DIFFERENTIAL/PLATELET - Abnormal; Notable for the following components:      Result Value   RBC 3.64 (*)    Hemoglobin 10.5 (*)    HCT 32.1 (*)    All other components within normal limits  COMPREHENSIVE METABOLIC PANEL - Abnormal; Notable for the following components:   Calcium 8.0 (*)    Alkaline Phosphatase 37 (*)    All other components within normal limits  URINALYSIS, ROUTINE W REFLEX MICROSCOPIC - Abnormal; Notable for the following components:   APPearance CLOUDY (*)    Leukocytes, UA SMALL (*)    All other components within normal limits  URINALYSIS, MICROSCOPIC (REFLEX) - Abnormal; Notable for the following components:   Bacteria, UA FEW (*)    All other components within normal limits  PREGNANCY, URINE  LIPASE, BLOOD    EKG None  Radiology Dg Abdomen 1 View  Result Date: 02/13/2018 CLINICAL DATA:  Constipation with nausea EXAM: ABDOMEN - 1 VIEW COMPARISON:  10/16/2012 FINDINGS: Punctate calcification in the region of left kidney. Nonobstructed bowel-gas pattern with mild to moderate stool. Phleboliths in the left pelvis. IMPRESSION:  Nonobstructed bowel-gas pattern. Possible punctate stone left kidney Electronically Signed   By: Jasmine Pang M.D.   On: 02/13/2018 20:06    Procedures Procedures (including critical care time)  Medications Ordered in ED Medications  ketorolac (TORADOL) 15 MG/ML injection 15 mg (15 mg Intravenous Given 02/13/18 2022)  sodium chloride 0.9 % bolus 500 mL (0 mLs Intravenous Stopped 02/13/18 2055)     Initial Impression / Assessment and Plan / ED Course  I have reviewed the triage vital signs and the nursing notes.  Pertinent labs & imaging results that were available during my care of the patient were reviewed by me and considered in my medical decision making (see chart for details).     Pt presenting for evaluation of abdominal pain and constipation.  Initial exam reassuring, she is afebrile not tachycardic.  Appears nontoxic.  Will  obtain basic abdominal labs, x-ray, UA.  Fluids and Toradol given for symptom control.  Reassuring, no leukocytosis.  Kidney, liver, pancreatic function reassuring.  Hemoglobin mildly low, but not acute or emergent. UA with small leuks and few bacteria, patient Artie has prescription for antibiotics.  We will have patient take as prescribed.  X-ray viewed and interpreted by me, shows stool burden without obvious obstruction or fecal impaction.  No obvious free air.  On reassessment, patient reports remains pain-free.  She is feeling hungry. At this time, doubt intraabd infection, perforation, or obstruction. Doubt incarcerated hernia, or surgical abd.  Discussed treatment for constipation with magnesium citrate and hydration.  Patient did take antibiotics for UTI as prescribed.  Recommended over-the-counter enemas as needed for further constipation.  Patient to follow-up with her GI doctor at her scheduled appointment next week.  At this time, patient appears safe for discharge.  Return precautions given.  Patient states she understands and agrees to plan.   Final  Clinical Impressions(s) / ED Diagnoses   Final diagnoses:  Constipation, unspecified constipation type    ED Discharge Orders         Ordered    magnesium citrate SOLN   Once     02/13/18 2129           Alveria Apley, PA-C 02/14/18 0001    Vanetta Mulders, MD 02/20/18 [phone]

## 2018-02-13 NOTE — Discharge Instructions (Signed)
Drink the bottle of magnesium citrate to encourage a bowel movement. Make sure you are staying well-hydrated with water. Take antibiotics as prescribed. You may try a stool softener such as Colace if your stools are very hard. If you are still having difficulty with bowel movements, you may buy an enema over-the-counter. Follow-up with your gastroenterologist for your symptoms are not improving.   Return to the emergency room if you develop fevers, chills, vomiting, or any new, worsening, or concerning symptoms.

## 2018-02-13 NOTE — ED Notes (Signed)
Pt verbalizes understanding of d/c instructions and denies any further need at this time. 

## 2018-02-13 NOTE — ED Triage Notes (Signed)
Pt reports constipation. Last BM was 2 days ago. Pt reports abdominal discomfort. Pt reports no nausea or vomiting.

## 2018-02-28 ENCOUNTER — Other Ambulatory Visit: Payer: Self-pay

## 2018-02-28 ENCOUNTER — Encounter (HOSPITAL_BASED_OUTPATIENT_CLINIC_OR_DEPARTMENT_OTHER): Payer: Self-pay

## 2018-02-28 DIAGNOSIS — M79606 Pain in leg, unspecified: Secondary | ICD-10-CM | POA: Insufficient documentation

## 2018-02-28 DIAGNOSIS — Z5321 Procedure and treatment not carried out due to patient leaving prior to being seen by health care provider: Secondary | ICD-10-CM | POA: Insufficient documentation

## 2018-02-28 NOTE — ED Triage Notes (Signed)
Pt c/o muscles spasms to legs-states I've always had them it's just taking longer to pass"-NAD-steady gait

## 2018-03-01 ENCOUNTER — Encounter (HOSPITAL_BASED_OUTPATIENT_CLINIC_OR_DEPARTMENT_OTHER): Payer: Self-pay

## 2018-03-01 ENCOUNTER — Emergency Department (HOSPITAL_BASED_OUTPATIENT_CLINIC_OR_DEPARTMENT_OTHER)
Admission: EM | Admit: 2018-03-01 | Discharge: 2018-03-01 | Disposition: A | Discharge status: Home / Self Care | Payer: Medicaid Other | Attending: Emergency Medicine | Admitting: Emergency Medicine

## 2018-03-01 ENCOUNTER — Emergency Department (HOSPITAL_BASED_OUTPATIENT_CLINIC_OR_DEPARTMENT_OTHER)
Admission: EM | Admit: 2018-03-01 | Discharge: 2018-03-01 | Disposition: A | Discharge status: Home / Self Care | Payer: Medicaid Other | Source: Home / Self Care | Attending: Emergency Medicine | Admitting: Emergency Medicine

## 2018-03-01 DIAGNOSIS — R109 Unspecified abdominal pain: Secondary | ICD-10-CM | POA: Insufficient documentation

## 2018-03-01 DIAGNOSIS — G8929 Other chronic pain: Secondary | ICD-10-CM | POA: Insufficient documentation

## 2018-03-01 DIAGNOSIS — F17210 Nicotine dependence, cigarettes, uncomplicated: Secondary | ICD-10-CM | POA: Insufficient documentation

## 2018-03-01 DIAGNOSIS — M62838 Other muscle spasm: Secondary | ICD-10-CM

## 2018-03-01 DIAGNOSIS — M791 Myalgia, unspecified site: Secondary | ICD-10-CM

## 2018-03-01 DIAGNOSIS — B9681 Helicobacter pylori [H. pylori] as the cause of diseases classified elsewhere: Secondary | ICD-10-CM | POA: Insufficient documentation

## 2018-03-01 HISTORY — DX: Other specified bacterial intestinal infections: A04.8

## 2018-03-01 LAB — URINALYSIS, ROUTINE W REFLEX MICROSCOPIC
Bilirubin Urine: NEGATIVE
GLUCOSE, UA: NEGATIVE mg/dL
Hgb urine dipstick: NEGATIVE
KETONES UR: 15 mg/dL — AB
Nitrite: NEGATIVE
PH: 6 (ref 5.0–8.0)
Protein, ur: NEGATIVE mg/dL
Specific Gravity, Urine: >1.03 — ABNORMAL HIGH (ref 1.005–1.030)

## 2018-03-01 LAB — COMPREHENSIVE METABOLIC PANEL
ALBUMIN: 4.3 g/dL (ref 3.5–5.0)
ALT: 12 U/L (ref 0–44)
AST: 23 U/L (ref 15–41)
Alkaline Phosphatase: 38 U/L (ref 38–126)
Anion gap: 8 (ref 5–15)
BILIRUBIN TOTAL: 0.4 mg/dL (ref 0.3–1.2)
BUN: 7 mg/dL (ref 6–20)
CHLORIDE: 104 mmol/L (ref 98–111)
CO2: 27 mmol/L (ref 22–32)
CREATININE: 0.54 mg/dL (ref 0.44–1.00)
Calcium: 8.5 mg/dL — ABNORMAL LOW (ref 8.9–10.3)
GFR calc Af Amer: >60 mL/min (ref >60)
GFR calc non Af Amer: >60 mL/min (ref >60)
GLUCOSE: 96 mg/dL (ref 70–99)
POTASSIUM: 3.3 mmol/L — AB (ref 3.5–5.1)
Sodium: 139 mmol/L (ref 135–145)
TOTAL PROTEIN: 7.6 g/dL (ref 6.5–8.1)

## 2018-03-01 LAB — CBC WITH DIFFERENTIAL/PLATELET
Basophils Absolute: 0 10*3/uL (ref 0.0–0.1)
Basophils Relative: 1 %
EOS PCT: 1 %
Eosinophils Absolute: 0 10*3/uL (ref 0.0–0.7)
HCT: 36.5 % (ref 36.0–46.0)
Hemoglobin: 12 g/dL (ref 12.0–15.0)
LYMPHS PCT: 29 %
Lymphs Abs: 1.2 10*3/uL (ref 0.7–4.0)
MCH: 28.6 pg (ref 26.0–34.0)
MCHC: 32.9 g/dL (ref 30.0–36.0)
MCV: 86.9 fL (ref 78.0–100.0)
MONO ABS: 0.4 10*3/uL (ref 0.1–1.0)
MONOS PCT: 11 %
Neutro Abs: 2.4 10*3/uL (ref 1.7–7.7)
Neutrophils Relative %: 58 %
Platelets: 220 10*3/uL (ref 150–400)
RBC: 4.2 MIL/uL (ref 3.87–5.11)
RDW: 14.1 % (ref 11.5–15.5)
WBC: 4.1 10*3/uL (ref 4.0–10.5)

## 2018-03-01 LAB — URINALYSIS, MICROSCOPIC (REFLEX)

## 2018-03-01 LAB — PREGNANCY, URINE: Preg Test, Ur: NEGATIVE

## 2018-03-01 LAB — LIPASE, BLOOD: Lipase: 25 U/L (ref 11–51)

## 2018-03-01 MED ORDER — PANTOPRAZOLE SODIUM 20 MG PO TBEC: 20.0000 mg | DELAYED_RELEASE_TABLET | Freq: Every day | ORAL | 0 refills | Status: DC

## 2018-03-01 MED ORDER — SUCRALFATE 1 GM/10ML PO SUSP: 1.0000 g | Freq: Three times a day (TID) | ORAL | 0 refills | Status: DC

## 2018-03-01 MED ORDER — SODIUM CHLORIDE 0.9 % IV BOLUS
1000.0000 mL | Freq: Once | INTRAVENOUS | Status: AC
  Administered 2018-03-01: 1000 mL via INTRAVENOUS

## 2018-03-01 MED ORDER — METHOCARBAMOL 500 MG PO TABS: 500.0000 mg | ORAL_TABLET | Freq: Two times a day (BID) | ORAL | 0 refills | Status: DC

## 2018-03-01 MED ORDER — POTASSIUM CHLORIDE CRYS ER 20 MEQ PO TBCR
20.0000 meq | EXTENDED_RELEASE_TABLET | Freq: Once | ORAL | Status: DC
  Filled 2018-03-01: qty 1

## 2018-03-01 MED ORDER — GI COCKTAIL ~~LOC~~
30.0000 mL | Freq: Once | ORAL | Status: AC
  Administered 2018-03-01: 30 mL via ORAL
  Filled 2018-03-01: qty 30

## 2018-03-01 NOTE — ED Provider Notes (Signed)
[facility] EMERGENCY DEPARTMENT Provider Note   CSN: [id number] Arrival date & time: 03/01/18  1821     History   Chief Complaint Chief Complaint  Patient presents with  . Pain    HPI Kelli Bowman is a [age] y.o. female possible history of thyroid disease, H. pylori infection presents for evaluation of generalized myalgias, chronic abdominal pain.  She reports that she has had muscle pains for the last several months.  She reports that she has been following her primary care doctor who attributed to her thyroid issues.  She is currently on thyroid medication, which she is compliant with.  Patient also reports that she has had 3 months of chest and abdominal pain.  States that abdominal pain is in the upper epigastric region and radiates up into the chest.  She states it is worse after eating and describes a burning sensation from the epigastric up into the chest area.  She reports that the pain comes and goes.  She reports that after she eats, she will have a few hours of the pain and then it resolves.  She states that she has been seen by GI, most recently 2 weeks ago where she had an endoscopy and colonoscopy done.  She states she tested positive for H. pylori.  She is a follow-up appointment with GI on 03/06/18.  She states that she came in today because she is worried that they did not put her on any medication and she continues to have pain she is worried her is a point her appointment is far away.  She denies any new or changes in symptoms.  Patient reports her muscle pains are primarily in her upper and lower extremities.  She states that she has had this for several months.  She denies any new preceding trauma, injury, fall.  Denies any strenuous or extensive exercise regimen.  She has been taking Tylenol with minimal improvement.  Patient denies any difficulty breathing, nausea/vomiting, hematuria, dysuria or numbness/weakness, fevers.  The history is provided by the patient.      Past Medical History:  Diagnosis Date  . H. pylori infection   . Thyroid disease     There are no active problems to display for this patient.   Past Surgical History:  Procedure Laterality Date  . CESAREAN SECTION    . HERNIA REPAIR       OB History   None      Home Medications    Prior to Admission medications   Medication Sig Start Date End Date Taking? Authorizing Provider  levothyroxine (SYNTHROID, LEVOTHROID) 125 MCG tablet Take 125 mcg by mouth daily before breakfast.    [provider]  lidocaine (XYLOCAINE) 2 % solution Use as directed 15 mLs in the mouth or throat as needed for mouth pain. 03/30/17   Maczis, Elmer SowMichael M, PA-C  methocarbamol (ROBAXIN) 500 MG tablet Take 1 tablet (500 mg total) by mouth 2 (two) times daily. 03/01/18   Maxwell CaulLayden, Abdishakur Gottschall A, PA-C  naproxen (NAPROSYN) 500 MG tablet Take 1 tablet (500 mg total) by mouth 2 (two) times daily. 08/23/17   Petrucelli, Samantha R, PA-C  pantoprazole (PROTONIX) 20 MG tablet Take 1 tablet (20 mg total) by mouth daily for 14 days. 03/01/18 03/15/18  Maxwell CaulLayden, Zianne Schubring A, PA-C  sucralfate (CARAFATE) 1 GM/10ML suspension Take 10 mLs (1 g total) by mouth 4 (four) times daily -  with meals and at bedtime. 03/01/18   Maxwell CaulLayden, Toretto Tingler A, PA-C  traMADol Janean Sark(ULTRAM)  50 MG tablet Take 1 tablet (50 mg total) by mouth every 6 (six) hours as needed. 03/30/17   Maczis, Elmer Sow, PA-C    Family History No family history on file.  Social History Social History   Tobacco Use  . Smoking status: Current Every Day Smoker    Packs/day: 0.50  . Smokeless tobacco: Never Used  Substance Use Topics  . Alcohol use: No  . Drug use: No     Allergies   Vicodin [hydrocodone-acetaminophen]   Review of Systems Review of Systems  Constitutional: Negative for fever.  Respiratory: Negative for shortness of breath.   Cardiovascular: Negative for chest pain.  Gastrointestinal: Positive for abdominal pain (Chronic). Negative for  nausea and vomiting.  Genitourinary: Negative for dysuria and hematuria.  Musculoskeletal: Positive for myalgias.  Neurological: Negative for weakness and numbness.  All other systems reviewed and are negative.    Physical Exam Updated Vital Signs BP 125/89 (BP Location: Right Arm)   Pulse 66   Temp 98.5 F (36.9 C) (Oral)   Resp 16   Ht 5\' 6"  (1.676 m)   Wt 48.1 kg   LMP 02/05/2018   SpO2 100%   BMI 17.11 kg/m   Physical Exam  Constitutional: She is oriented to person, place, and time. She appears well-developed and well-nourished.  HENT:  Head: Normocephalic and atraumatic.  Mouth/Throat: Oropharynx is clear and moist and mucous membranes are normal.  Eyes: Pupils are equal, round, and reactive to light. Conjunctivae, EOM and lids are normal.  Neck: Full passive range of motion without pain.  Cardiovascular: Normal rate, regular rhythm, normal heart sounds and normal pulses. Exam reveals no gallop and no friction rub.  No murmur heard. Pulses:      Radial pulses are 2+ on the right side, and 2+ on the left side.       Dorsalis pedis pulses are 2+ on the right side, and 2+ on the left side.  Pulmonary/Chest: Effort normal and breath sounds normal.  Lungs clear to auscultation bilaterally.  Symmetric chest rise.  No wheezing, rales, rhonchi.  Abdominal: Soft. Normal appearance. There is generalized tenderness. There is no rigidity, no guarding, no CVA tenderness, no tenderness at McBurney's point and negative Murphy's sign.  Abdomen is soft, nondistended.  Generalized tenderness no focal point.  No rigidity, guarding.  Musculoskeletal: Normal range of motion.  Neurological: She is alert and oriented to person, place, and time.  Follows commands, Moves all extremities  5/5 strength to BUE and BLE  Sensation intact throughout all major nerve distributions  Skin: Skin is warm and dry. Capillary refill takes less than 2 seconds.  Psychiatric: She has a normal mood and affect.  Her speech is normal.  Nursing note and vitals reviewed.    ED Treatments / Results  Labs (all labs ordered are listed, but only abnormal results are displayed) Labs Reviewed  COMPREHENSIVE METABOLIC PANEL - Abnormal; Notable for the following components:      Result Value   Potassium 3.3 (*)    Calcium 8.5 (*)    All other components within normal limits  URINALYSIS, ROUTINE W REFLEX MICROSCOPIC - Abnormal; Notable for the following components:   APPearance CLOUDY (*)    Specific Gravity, Urine >1.030 (*)    Ketones, ur 15 (*)    Leukocytes, UA SMALL (*)    All other components within normal limits  URINALYSIS, MICROSCOPIC (REFLEX) - Abnormal; Notable for the following components:   Bacteria, UA RARE (*)  All other components within normal limits  URINE CULTURE  LIPASE, BLOOD  CBC WITH DIFFERENTIAL/PLATELET  PREGNANCY, URINE    EKG None  Radiology No results found.  Procedures Procedures (including critical care time)  Medications Ordered in ED Medications  potassium chloride SA (K-DUR,KLOR-CON) CR tablet 20 mEq (20 mEq Oral Refused 03/01/18 2055)  sodium chloride 0.9 % bolus 1,000 mL ( Intravenous Stopped 03/01/18 2022)  gi cocktail (Maalox,Lidocaine,Donnatal) (30 mLs Oral Given 03/01/18 1956)     Initial Impression / Assessment and Plan / ED Course  I have reviewed the triage vital signs and the nursing notes.  Pertinent labs & imaging results that were available during my care of the patient were reviewed by me and considered in my medical decision making (see chart for details).     32 year old female with past medical history of H. pylori, thyroid disease presents for evaluation of myalgias, chronic abdominal pain.  Reports she has been having muscle pain and spasms for several months.  Is followed by her PCP and was thought to treat her thyroid.  She is currently taking her medications.  No recent high intensity workouts, physical activity.  Additionally,  has some chronic abdominal pain that is been ongoing for several months.  Seen by GI and was diagnosed with H. pylori.  Triage note mentions chest pain.  When I discussed this with patient she states that she will get a burning sensation in her epigastric stomach region that radiates up her chest new.  No chest pain concerning for ACS etiology.  Suspect GERD versus PUD. Patient is afebrile, non-toxic appearing, sitting comfortably on examination table. Vital signs reviewed and stable.  On exam, patient has diffuse generalized tenderness with no focal point.  Patient has normal strength without any signs of neuro deficits.  Consider electively imbalance versus muscle spasms.  Additionally, consider GERD versus PUD.  History/physical exam not concerning for appendicitis, diverticular-itis, perforation, obstruction, ovarian etiology.  History/physical exam is not concerning for rhabdomyolysis.  Do not suspect thyroid storm given history/physical exam.  We will plan to check basic labs, UA.  Lipase is unremarkable.  CMP shows potassium of 3.3.  Otherwise unremarkable.  UA shows small leukocytes and Pyuria.  Will send for culture.  CBC without any significant leukocytosis or anemia.  Reevaluation.  Patient reports improvement in pain after GI cocktail.  Repeat abdominal exam is benign.  Exam not concerning for appendicitis, diverticulitis or surgical pathology.  No indication for further CT imaging at this time.  I discussed with patient at length regarding her GI issues.  She has an appointment scheduled with them on 03/06/18.  Currently her pain is worse after eating.  We will plan to give her Carafate for symptomatic relief.  Additionally, patient with no urinary completes.  Urine showed slight leukocytes and pyuria.  Urine culture sent.  No indication for antibiotic therapy at this time.  Encourage follow-up with primary care doctor regarding her chronic muscle pains and thyroid issues. Patient had ample  opportunity for questions and discussion. All patient's questions were answered with full understanding. Strict return precautions discussed. Patient expresses understanding and agreement to plan.   Final Clinical Impressions(s) / ED Diagnoses   Final diagnoses:  Chronic abdominal pain  Myalgia  Muscle spasm    ED Discharge Orders         Ordered    sucralfate (CARAFATE) 1 GM/10ML suspension  3 times daily with meals & bedtime     03/01/18 2103  pantoprazole (PROTONIX) 20 MG tablet  Daily     03/01/18 2103    methocarbamol (ROBAXIN) 500 MG tablet  2 times daily     03/01/18 2103           Rosana Hoes 03/01/18 2239    Melene Plan, DO 03/01/18 2243

## 2018-03-01 NOTE — ED Notes (Signed)
ED Provider at bedside. 

## 2018-03-01 NOTE — ED Triage Notes (Signed)
Pt c/o chronic muscle pain from her thyroid dx, chronic abdominal pain her hernia and upper epigastric pain; states became wore when she went to work 1.5hrs ago

## 2018-03-01 NOTE — Discharge Instructions (Signed)
You can take 1000 mg of Tylenol.  Do not exceed 4000 mg of Tylenol a day.  Take Robaxin as prescribed. This medication will make you drowsy so do not drive or drink alcohol when taking it.  Take Carafate and Protonix as directed.  As we discussed, you need to follow-up with your referred GI doctor for your previously scheduled appointment.  Return to the emergency department for any worsening pain, persistent vomiting, fever, difficulty breathing, chest pain or any other worsening or concerning symptoms.

## 2018-03-03 LAB — URINE CULTURE: CULTURE: NO GROWTH

## 2018-05-18 ENCOUNTER — Encounter (HOSPITAL_BASED_OUTPATIENT_CLINIC_OR_DEPARTMENT_OTHER): Payer: Self-pay

## 2018-05-18 ENCOUNTER — Emergency Department (HOSPITAL_BASED_OUTPATIENT_CLINIC_OR_DEPARTMENT_OTHER)
Admission: EM | Admit: 2018-05-18 | Discharge: 2018-05-18 | Disposition: A | Discharge status: Home / Self Care | Payer: Medicaid Other | Attending: Emergency Medicine | Admitting: Emergency Medicine

## 2018-05-18 DIAGNOSIS — F17210 Nicotine dependence, cigarettes, uncomplicated: Secondary | ICD-10-CM | POA: Diagnosis not present

## 2018-05-18 DIAGNOSIS — Z79899 Other long term (current) drug therapy: Secondary | ICD-10-CM | POA: Insufficient documentation

## 2018-05-18 DIAGNOSIS — G8929 Other chronic pain: Secondary | ICD-10-CM | POA: Diagnosis not present

## 2018-05-18 DIAGNOSIS — M62838 Other muscle spasm: Secondary | ICD-10-CM | POA: Insufficient documentation

## 2018-05-18 DIAGNOSIS — E876 Hypokalemia: Secondary | ICD-10-CM | POA: Diagnosis not present

## 2018-05-18 DIAGNOSIS — E039 Hypothyroidism, unspecified: Secondary | ICD-10-CM | POA: Diagnosis not present

## 2018-05-18 DIAGNOSIS — M79606 Pain in leg, unspecified: Secondary | ICD-10-CM | POA: Diagnosis present

## 2018-05-18 LAB — CBC WITH DIFFERENTIAL/PLATELET
Abs Immature Granulocytes: 0.01 10*3/uL (ref 0.00–0.07)
Basophils Absolute: 0 10*3/uL (ref 0.0–0.1)
Basophils Relative: 1 %
Eosinophils Absolute: 0 10*3/uL (ref 0.0–0.5)
Eosinophils Relative: 1 %
HCT: 38.3 % (ref 36.0–46.0)
Hemoglobin: 12.1 g/dL (ref 12.0–15.0)
Immature Granulocytes: 0 %
Lymphocytes Relative: 31 %
Lymphs Abs: 1.5 10*3/uL (ref 0.7–4.0)
MCH: 28.3 pg (ref 26.0–34.0)
MCHC: 31.6 g/dL (ref 30.0–36.0)
MCV: 89.7 fL (ref 80.0–100.0)
Monocytes Absolute: 0.4 10*3/uL (ref 0.1–1.0)
Monocytes Relative: 8 %
Neutro Abs: 2.9 10*3/uL (ref 1.7–7.7)
Neutrophils Relative %: 59 %
Platelets: 194 10*3/uL (ref 150–400)
RBC: 4.27 MIL/uL (ref 3.87–5.11)
RDW: 15.6 % — ABNORMAL HIGH (ref 11.5–15.5)
WBC: 4.9 10*3/uL (ref 4.0–10.5)
nRBC: 0 % (ref 0.0–0.2)

## 2018-05-18 LAB — BASIC METABOLIC PANEL WITH GFR
Anion gap: 9 (ref 5–15)
BUN: 11 mg/dL (ref 6–20)
CO2: 23 mmol/L (ref 22–32)
Calcium: 8.8 mg/dL — ABNORMAL LOW (ref 8.9–10.3)
Chloride: 104 mmol/L (ref 98–111)
Creatinine, Ser: 0.73 mg/dL (ref 0.44–1.00)
GFR calc Af Amer: >60 mL/min
GFR calc non Af Amer: >60 mL/min
Glucose, Bld: 91 mg/dL (ref 70–99)
Potassium: 3.3 mmol/L — ABNORMAL LOW (ref 3.5–5.1)
Sodium: 136 mmol/L (ref 135–145)

## 2018-05-18 LAB — T4, FREE: Free T4: 0.61 ng/dL — ABNORMAL LOW (ref 0.82–1.77)

## 2018-05-18 LAB — TSH: TSH: 21.329 u[IU]/mL — AB (ref 0.350–4.500)

## 2018-05-18 MED ORDER — POTASSIUM CHLORIDE CRYS ER 20 MEQ PO TBCR: 20.0000 meq | EXTENDED_RELEASE_TABLET | Freq: Two times a day (BID) | ORAL | 0 refills | Status: DC

## 2018-05-18 MED ORDER — METHOCARBAMOL 500 MG PO TABS: 500.0000 mg | ORAL_TABLET | Freq: Two times a day (BID) | ORAL | 0 refills | Status: DC

## 2018-05-18 NOTE — ED Triage Notes (Signed)
Pt c/o rt knee pain and chronic muscle spasms

## 2018-05-18 NOTE — ED Provider Notes (Signed)
[facility] EMERGENCY DEPARTMENT Provider Note   CSN: [id number] Arrival date & time: 05/18/18  0704     History   Chief Complaint Chief Complaint  Patient presents with  . Knee Pain    HPI Kelli Bowman is a 32 y.o. female.  Patient is a 32 year old female with history of hypothyroidism, GERD, and chronic muscle spasms.  She presents today for evaluation of pain in her legs that she describes as a "muscle spasm".  She denies any specific injury or trauma.  She has a history of hypothyroidism, however has been off of her thyroid medication since September.  She does not have an endocrinologist, but does have an appointment scheduled for January to see a new one.  She denies any fevers or chills.     Past Medical History:  Diagnosis Date  . H. pylori infection   . Thyroid disease     There are no active problems to display for this patient.   Past Surgical History:  Procedure Laterality Date  . CESAREAN SECTION    . HERNIA REPAIR       OB History   None      Home Medications    Prior to Admission medications   Medication Sig Start Date End Date Taking? Authorizing Provider  levothyroxine (SYNTHROID, LEVOTHROID) 125 MCG tablet Take 125 mcg by mouth daily before breakfast.    [provider]  lidocaine (XYLOCAINE) 2 % solution Use as directed 15 mLs in the mouth or throat as needed for mouth pain. 03/30/17   Maczis, Elmer SowMichael M, PA-C  methocarbamol (ROBAXIN) 500 MG tablet Take 1 tablet (500 mg total) by mouth 2 (two) times daily. 03/01/18   Maxwell CaulLayden, Lindsey A, PA-C  naproxen (NAPROSYN) 500 MG tablet Take 1 tablet (500 mg total) by mouth 2 (two) times daily. 08/23/17   Petrucelli, Samantha R, PA-C  pantoprazole (PROTONIX) 20 MG tablet Take 1 tablet (20 mg total) by mouth daily for 14 days. 03/01/18 03/15/18  Maxwell CaulLayden, Lindsey A, PA-C  sucralfate (CARAFATE) 1 GM/10ML suspension Take 10 mLs (1 g total) by mouth 4 (four) times daily -  with meals and at  bedtime. 03/01/18   Maxwell CaulLayden, Lindsey A, PA-C  traMADol (ULTRAM) 50 MG tablet Take 1 tablet (50 mg total) by mouth every 6 (six) hours as needed. 03/30/17   Maczis, Elmer SowMichael M, PA-C    Family History No family history on file.  Social History Social History   Tobacco Use  . Smoking status: Current Every Day Smoker    Packs/day: 0.50  . Smokeless tobacco: Never Used  Substance Use Topics  . Alcohol use: No  . Drug use: No     Allergies   Vicodin [hydrocodone-acetaminophen]   Review of Systems Review of Systems  All other systems reviewed and are negative.    Physical Exam Updated Vital Signs BP 114/79 (BP Location: Left Arm)   Pulse 80   Temp 98.3 F (36.8 C) (Oral)   Resp 18   Ht 5\' 6"  (1.676 m)   Wt 46.7 kg   LMP 05/08/2018   SpO2 100%   BMI 16.62 kg/m   Physical Exam  Constitutional: She is oriented to person, place, and time. She appears well-developed and well-nourished. No distress.  HENT:  Head: Normocephalic and atraumatic.  Neck: Normal range of motion. Neck supple.  Cardiovascular: Normal rate and regular rhythm. Exam reveals no gallop and no friction rub.  No murmur heard. Pulmonary/Chest: Effort normal and breath sounds normal.  No respiratory distress. She has no wheezes.  Abdominal: Soft. Bowel sounds are normal. She exhibits no distension. There is no tenderness.  Musculoskeletal: Normal range of motion.  Patient is very thin.  Her knees appear grossly normal with no swelling or effusion.  She has good range of motion laterally with no laxity to anterior/posterior drawer test and with varus or valgus stress.  DP pulses are easily palpable bilaterally and there is no swelling or edema.  Neurological: She is alert and oriented to person, place, and time.  Skin: Skin is warm and dry. She is not diaphoretic.  Nursing note and vitals reviewed.    ED Treatments / Results  Labs (all labs ordered are listed, but only abnormal results are  displayed) Labs Reviewed - No data to display  EKG None  Radiology No results found.  Procedures Procedures (including critical care time)  Medications Ordered in ED Medications - No data to display   Initial Impression / Assessment and Plan / ED Course  I have reviewed the triage vital signs and the nursing notes.  Pertinent labs & imaging results that were available during my care of the patient were reviewed by me and considered in my medical decision making (see chart for details).  Patient with history of hypothyroidism presenting with complaints of muscle spasms.  She has been having this intermittently for quite some time.  She has been off of her thyroid medication for the past several months awaiting an appointment with a endocrinologist.  Her physical examination today is unremarkable and laboratory studies are essentially normal with the exception of a mildly low potassium level.  She will be prescribed a potassium pill along with a muscle relaxer.  She is also to follow-up with endocrinology to discuss her thyroid issues.  Thyroid studies are pending and likely will not result until later today or tomorrow.  Patient will be called if these are abnormal and require further attention.  Final Clinical Impressions(s) / ED Diagnoses   Final diagnoses:  None    ED Discharge Orders    None       Geoffery Lyons, MD 05/18/18 [phone]

## 2018-05-18 NOTE — Discharge Instructions (Addendum)
Potassium as prescribed.  Robaxin as prescribed as needed for pain.  Follow-up with endocrinology as scheduled, sooner if so desired.  The contact information for [facility] has been provided in this discharge summary for you to call and make these arrangements.  We will call you if your thyroid studies indicate you require further treatment or action.

## 2018-05-19 LAB — T3: T3, Total: 85 ng/dL (ref 71–180)

## 2018-06-01 ENCOUNTER — Emergency Department (HOSPITAL_COMMUNITY)
Admission: EM | Admit: 2018-06-01 | Discharge: 2018-06-01 | Disposition: A | Discharge status: Home / Self Care | Payer: Medicaid Other | Attending: Emergency Medicine | Admitting: Emergency Medicine

## 2018-06-01 ENCOUNTER — Encounter (HOSPITAL_COMMUNITY): Payer: Self-pay | Admitting: *Deleted

## 2018-06-01 DIAGNOSIS — F17210 Nicotine dependence, cigarettes, uncomplicated: Secondary | ICD-10-CM | POA: Insufficient documentation

## 2018-06-01 DIAGNOSIS — Z79899 Other long term (current) drug therapy: Secondary | ICD-10-CM | POA: Diagnosis not present

## 2018-06-01 DIAGNOSIS — M62838 Other muscle spasm: Secondary | ICD-10-CM | POA: Insufficient documentation

## 2018-06-01 DIAGNOSIS — E039 Hypothyroidism, unspecified: Secondary | ICD-10-CM | POA: Insufficient documentation

## 2018-06-01 MED ORDER — LEVOTHYROXINE SODIUM 125 MCG PO TABS: 125.0000 ug | ORAL_TABLET | Freq: Every day | ORAL | 0 refills | Status: AC

## 2018-06-01 NOTE — Discharge Instructions (Signed)
As discussed, your evaluation today has been largely reassuring.  But, it is important that you monitor your condition carefully, and do not hesitate to return to the ED if you develop new, or concerning changes in your condition. ? ?Otherwise, please follow-up with your physician for appropriate ongoing care. ? ?

## 2018-06-01 NOTE — ED Notes (Signed)
Pt given paperwork with prescription as ordered by EDP. Pt refused prescription, stating "why would I take that dose and kill myself" before walking out of room without paperwork. Pt left before wristband was removed. Paperwork taken to front desk in the event pt returns for paperwork.

## 2018-06-01 NOTE — ED Triage Notes (Signed)
Pt in c/o intermittent muscle cramps, mostly in her legs, states this is a chronic issue for her due to her hypothyroidism, ambulatory without distress

## 2018-06-01 NOTE — ED Provider Notes (Signed)
[facility] Provider Note   CSN: [id number] Arrival date & time: 06/01/18  1238     History   Chief Complaint Chief Complaint  Patient presents with  . Muscle Pain    HPI Kelli Bowman is a 32 y.o. female.  HPI  Presents with concern ongoing spasms in her legs. She acknowledges multiple medical issues including hypothyroidism, anemia. She notes that she has not seen an endocrinologist in some time, is scheduled to do so in February, 2020. She notes that over the past she has had intermittent episodes of muscle spasms in her legs, but the sensation has become more persistent, more frequent, and currently is very uncomfortable. With this concern she went to our facility facility 2 weeks ago. Evaluation including labs, initiate a course of muscle relaxant, and now notes that this has not had appreciable change in her condition. She denies other new changes, including fever, chest pain, dyspnea or other concerns.   Past Medical History:  Diagnosis Date  . H. pylori infection   . Thyroid disease     There are no active problems to display for this patient.   Past Surgical History:  Procedure Laterality Date  . CESAREAN SECTION    . HERNIA REPAIR       OB History   No obstetric history on file.      Home Medications    Prior to Admission medications   Medication Sig Start Date End Date Taking? Authorizing Provider  levothyroxine (SYNTHROID, LEVOTHROID) 125 MCG tablet Take 125 mcg by mouth daily before breakfast.    [provider]  lidocaine (XYLOCAINE) 2 % solution Use as directed 15 mLs in the mouth or throat as needed for mouth pain. 03/30/17   Maczis, Elmer SowMichael M, PA-C  methocarbamol (ROBAXIN) 500 MG tablet Take 1 tablet (500 mg total) by mouth 2 (two) times daily. 05/18/18   Geoffery Lyonselo, Douglas, MD  naproxen (NAPROSYN) 500 MG tablet Take 1 tablet (500 mg total) by mouth 2 (two) times daily. 08/23/17   Petrucelli,  Samantha R, PA-C  pantoprazole (PROTONIX) 20 MG tablet Take 1 tablet (20 mg total) by mouth daily for 14 days. 03/01/18 03/15/18  Graciella FreerLayden, Lindsey A, PA-C  potassium chloride SA (K-DUR,KLOR-CON) 20 MEQ tablet Take 1 tablet (20 mEq total) by mouth 2 (two) times daily. 05/18/18   Geoffery Lyonselo, Douglas, MD  sucralfate (CARAFATE) 1 GM/10ML suspension Take 10 mLs (1 g total) by mouth 4 (four) times daily -  with meals and at bedtime. 03/01/18   Maxwell CaulLayden, Lindsey A, PA-C  traMADol (ULTRAM) 50 MG tablet Take 1 tablet (50 mg total) by mouth every 6 (six) hours as needed. 03/30/17   Maczis, Elmer SowMichael M, PA-C    Family History History reviewed. No pertinent family history.  Social History Social History   Tobacco Use  . Smoking status: Current Every Day Smoker    Packs/day: 0.50  . Smokeless tobacco: Never Used  Substance Use Topics  . Alcohol use: No  . Drug use: No     Allergies   Vicodin [hydrocodone-acetaminophen]   Review of Systems Review of Systems  Constitutional:       Per HPI, otherwise negative  HENT:       Per HPI, otherwise negative  Respiratory:       Per HPI, otherwise negative  Cardiovascular:       Per HPI, otherwise negative  Gastrointestinal: Negative for vomiting.  Endocrine:       Negative aside from  HPI  Genitourinary:       Neg aside from HPI   Musculoskeletal:       Per HPI, otherwise negative  Skin: Negative.   Neurological: Negative for syncope.     Physical Exam Updated Vital Signs BP 113/81 (BP Location: Right Arm)   Pulse 77   Temp 98.4 F (36.9 C) (Oral)   Resp 20   LMP 05/08/2018   SpO2 99%   Physical Exam Vitals signs and nursing note reviewed.  Constitutional:      General: She is not in acute distress.    Appearance: She is well-developed.  HENT:     Head: Normocephalic and atraumatic.  Eyes:     Conjunctiva/sclera: Conjunctivae normal.  Cardiovascular:     Rate and Rhythm: Normal rate and regular rhythm.  Pulmonary:     Effort: Pulmonary  effort is normal. No respiratory distress.     Breath sounds: Normal breath sounds. No stridor.  Abdominal:     General: There is no distension.  Skin:    General: Skin is warm and dry.  Neurological:     Mental Status: She is alert and oriented to person, place, and time.     Cranial Nerves: No cranial nerve deficit.      ED Treatments / Results  Labs Labs from a most recent emergency department visit now available, with TSH greater than 21, free T4 subtherapeutic.   Procedures Procedures (including critical care time)  Medications Ordered in ED Medications - No data to display   Initial Impression / Assessment and Plan / ED Course  I have reviewed the triage vital signs and the nursing notes.  Pertinent labs & imaging results that were available during my care of the patient were reviewed by me and considered in my medical decision making (see chart for details).  I reviewed the records from patient's recent evaluation, including evidence for persistent hypothyroidism. Here she is awake, alert, afebrile, hemodynamically unremarkable, moving all extremities spontaneously, has no ongoing deformity, limitation.  We discussed options for intervention, the patient will start thyroid supplement again, will follow up with primary care and endocrinology.   Final Clinical Impressions(s) / ED Diagnoses  Muscle spasm   Gerhard MunchLockwood, Kandi Brusseau, MD 06/01/18 [phone]

## 2018-06-20 ENCOUNTER — Emergency Department (HOSPITAL_BASED_OUTPATIENT_CLINIC_OR_DEPARTMENT_OTHER): Payer: Medicaid Other

## 2018-06-20 ENCOUNTER — Other Ambulatory Visit: Payer: Self-pay

## 2018-06-20 ENCOUNTER — Encounter (HOSPITAL_BASED_OUTPATIENT_CLINIC_OR_DEPARTMENT_OTHER): Payer: Self-pay | Admitting: Adult Health

## 2018-06-20 ENCOUNTER — Emergency Department (HOSPITAL_BASED_OUTPATIENT_CLINIC_OR_DEPARTMENT_OTHER)
Admission: EM | Admit: 2018-06-20 | Discharge: 2018-06-20 | Disposition: A | Discharge status: Home / Self Care | Payer: Medicaid Other | Attending: Emergency Medicine | Admitting: Emergency Medicine

## 2018-06-20 DIAGNOSIS — F17210 Nicotine dependence, cigarettes, uncomplicated: Secondary | ICD-10-CM | POA: Diagnosis not present

## 2018-06-20 DIAGNOSIS — R109 Unspecified abdominal pain: Secondary | ICD-10-CM | POA: Diagnosis present

## 2018-06-20 DIAGNOSIS — R1013 Epigastric pain: Secondary | ICD-10-CM | POA: Diagnosis not present

## 2018-06-20 DIAGNOSIS — Z79899 Other long term (current) drug therapy: Secondary | ICD-10-CM | POA: Insufficient documentation

## 2018-06-20 LAB — URINALYSIS, ROUTINE W REFLEX MICROSCOPIC
Bilirubin Urine: NEGATIVE
Glucose, UA: NEGATIVE mg/dL
Hgb urine dipstick: NEGATIVE
Ketones, ur: NEGATIVE mg/dL
LEUKOCYTES UA: NEGATIVE
Nitrite: NEGATIVE
PROTEIN: NEGATIVE mg/dL
Specific Gravity, Urine: 1.01 (ref 1.005–1.030)
pH: 7.5 (ref 5.0–8.0)

## 2018-06-20 LAB — CBC
HCT: 39.4 % (ref 36.0–46.0)
HEMOGLOBIN: 12.1 g/dL (ref 12.0–15.0)
MCH: 29.1 pg (ref 26.0–34.0)
MCHC: 30.7 g/dL (ref 30.0–36.0)
MCV: 94.7 fL (ref 80.0–100.0)
NRBC: 0 % (ref 0.0–0.2)
PLATELETS: 155 10*3/uL (ref 150–400)
RBC: 4.16 MIL/uL (ref 3.87–5.11)
RDW: 15.3 % (ref 11.5–15.5)
WBC: 5 10*3/uL (ref 4.0–10.5)

## 2018-06-20 LAB — COMPREHENSIVE METABOLIC PANEL
ALK PHOS: 32 U/L — AB (ref 38–126)
ALT: 10 U/L (ref 0–44)
ANION GAP: 8 (ref 5–15)
AST: 21 U/L (ref 15–41)
Albumin: 4.6 g/dL (ref 3.5–5.0)
BILIRUBIN TOTAL: 0.3 mg/dL (ref 0.3–1.2)
BUN: 8 mg/dL (ref 6–20)
CALCIUM: 8.8 mg/dL — AB (ref 8.9–10.3)
CO2: 24 mmol/L (ref 22–32)
Chloride: 104 mmol/L (ref 98–111)
Creatinine, Ser: 0.7 mg/dL (ref 0.44–1.00)
Glucose, Bld: 110 mg/dL — ABNORMAL HIGH (ref 70–99)
POTASSIUM: 4.1 mmol/L (ref 3.5–5.1)
Sodium: 136 mmol/L (ref 135–145)
TOTAL PROTEIN: 7.9 g/dL (ref 6.5–8.1)

## 2018-06-20 LAB — PREGNANCY, URINE: PREG TEST UR: NEGATIVE

## 2018-06-20 LAB — LIPASE, BLOOD: Lipase: 24 U/L (ref 11–51)

## 2018-06-20 MED ORDER — ONDANSETRON HCL 4 MG/2ML IJ SOLN
INTRAMUSCULAR | Status: AC
  Administered 2018-06-20: 4 mg via INTRAVENOUS
  Filled 2018-06-20: qty 2

## 2018-06-20 MED ORDER — IOPAMIDOL (ISOVUE-300) INJECTION 61%
30.0000 mL | Freq: Once | INTRAVENOUS | Status: AC | PRN
  Administered 2018-06-20: 30 mL via ORAL

## 2018-06-20 MED ORDER — SODIUM CHLORIDE 0.9 % IV BOLUS
1000.0000 mL | Freq: Once | INTRAVENOUS | Status: AC
  Administered 2018-06-20: 1000 mL via INTRAVENOUS

## 2018-06-20 MED ORDER — ONDANSETRON HCL 4 MG/2ML IJ SOLN
4.0000 mg | Freq: Once | INTRAMUSCULAR | Status: AC | PRN
  Administered 2018-06-20: 4 mg via INTRAVENOUS

## 2018-06-20 MED ORDER — PANTOPRAZOLE SODIUM 20 MG PO TBEC: 40.0000 mg | DELAYED_RELEASE_TABLET | Freq: Every day | ORAL | 0 refills | Status: DC

## 2018-06-20 MED ORDER — IOPAMIDOL (ISOVUE-300) INJECTION 61%
100.0000 mL | Freq: Once | INTRAVENOUS | Status: AC | PRN
  Administered 2018-06-20: 100 mL via INTRAVENOUS

## 2018-06-20 NOTE — ED Triage Notes (Signed)
Presents with upper abdominal pain, but reports that the pain is all over her abdomen as well and described as sharp. SHe endorses nausea, denies vomiting. REports that she had a hard stool today. Abdomen is distended. LMP 05/26/18. denies fevers and chills. Pain began at 4 am this morning.

## 2018-06-20 NOTE — ED Notes (Signed)
PT vomited and reports feeling much better. She is now on her phone, laughing and denies pain at this time.

## 2018-06-20 NOTE — ED Provider Notes (Signed)
[facility] EMERGENCY DEPARTMENT Provider Note   CSN: [id number] Arrival date & time: 06/20/18  [id number]     History   Chief Complaint Chief Complaint  Patient presents with  . Abdominal Pain    HPI Kelli Bowman is a 33 y.o. female.  HPI  Presents with abdominal pain, began around 4AM, sharp epigastric pain, feels like it hurts over ventral hernia repair site. Was right at the top of abdomen, after trying to use bathroom, and tried to drink soda then felt pain radiating to whole abdomen.   Severe nausea at home, threw up since coming to ED. Stomach does not hurt as much as it did before vomiting.  No diarrhea, passing flatus, yesterday had a BM  No hx of cholecystectomy Has otherwise not had anything to eat or drink, not sure if affected by pain    Past Medical History:  Diagnosis Date  . H. pylori infection   . Thyroid disease     There are no active problems to display for this patient.   Past Surgical History:  Procedure Laterality Date  . CESAREAN SECTION    . HERNIA REPAIR       OB History   No obstetric history on file.      Home Medications    Prior to Admission medications   Medication Sig Start Date End Date Taking? Authorizing Provider  levothyroxine (SYNTHROID, LEVOTHROID) 125 MCG tablet Take 1 tablet (125 mcg total) by mouth daily before breakfast. 06/01/18   Gerhard MunchLockwood, Robert, MD  lidocaine (XYLOCAINE) 2 % solution Use as directed 15 mLs in the mouth or throat as needed for mouth pain. 03/30/17   Maczis, Elmer SowMichael M, PA-C  methocarbamol (ROBAXIN) 500 MG tablet Take 1 tablet (500 mg total) by mouth 2 (two) times daily. 05/18/18   Geoffery Lyonselo, Douglas, MD  naproxen (NAPROSYN) 500 MG tablet Take 1 tablet (500 mg total) by mouth 2 (two) times daily. 08/23/17   Petrucelli, Samantha R, PA-C  pantoprazole (PROTONIX) 20 MG tablet Take 2 tablets (40 mg total) by mouth daily for 7 days. 06/20/18 06/27/18  Alvira MondaySchlossman, Najma Bozarth, MD  potassium chloride SA  (K-DUR,KLOR-CON) 20 MEQ tablet Take 1 tablet (20 mEq total) by mouth 2 (two) times daily. 05/18/18   Geoffery Lyonselo, Douglas, MD  sucralfate (CARAFATE) 1 GM/10ML suspension Take 10 mLs (1 g total) by mouth 4 (four) times daily -  with meals and at bedtime. 03/01/18   Maxwell CaulLayden, Lindsey A, PA-C  traMADol (ULTRAM) 50 MG tablet Take 1 tablet (50 mg total) by mouth every 6 (six) hours as needed. 03/30/17   Maczis, Elmer SowMichael M, PA-C    Family History History reviewed. No pertinent family history.  Social History Social History   Tobacco Use  . Smoking status: Current Every Day Smoker    Packs/day: 0.50  . Smokeless tobacco: Never Used  Substance Use Topics  . Alcohol use: No  . Drug use: No     Allergies   Vicodin [hydrocodone-acetaminophen]   Review of Systems Review of Systems  Constitutional: Negative for fever (felt a little warm but no known fever).  Respiratory: Negative for cough.   Cardiovascular: Negative for chest pain.  Gastrointestinal: Positive for abdominal pain, nausea and vomiting. Negative for constipation and diarrhea.  Genitourinary: Negative for dysuria, vaginal bleeding and vaginal discharge.     Physical Exam Updated Vital Signs BP 128/78   Pulse 67   Temp (!) 97.5 F (36.4 C) (Oral)   Resp 18   Ht 5'  6" (1.676 m)   Wt 45.4 kg   LMP 05/26/2018 (Exact Date)   SpO2 100%   BMI 16.14 kg/m   Physical Exam Vitals signs and nursing note reviewed.  Constitutional:      General: She is not in acute distress.    Appearance: She is well-developed. She is not diaphoretic.  HENT:     Head: Normocephalic and atraumatic.  Eyes:     Conjunctiva/sclera: Conjunctivae normal.  Neck:     Musculoskeletal: Normal range of motion.  Cardiovascular:     Rate and Rhythm: Normal rate and regular rhythm.     Heart sounds: No friction rub.  Pulmonary:     Effort: Pulmonary effort is normal. No respiratory distress.  Abdominal:     General: There is no distension.     Palpations:  Abdomen is soft.     Tenderness: There is abdominal tenderness in the right upper quadrant and epigastric area. There is guarding. Positive signs include Murphy's sign.     Hernia: A hernia (reduced) is present. Hernia is present in the ventral area.  Musculoskeletal:        General: No tenderness.  Skin:    General: Skin is warm and dry.     Findings: No erythema or rash.  Neurological:     Mental Status: She is alert and oriented to person, place, and time.      ED Treatments / Results  Labs (all labs ordered are listed, but only abnormal results are displayed) Labs Reviewed  COMPREHENSIVE METABOLIC PANEL - Abnormal; Notable for the following components:      Result Value   Glucose, Bld 110 (*)    Calcium 8.8 (*)    Alkaline Phosphatase 32 (*)    All other components within normal limits  LIPASE, BLOOD  CBC  URINALYSIS, ROUTINE W REFLEX MICROSCOPIC  PREGNANCY, URINE    EKG None  Radiology Ct Abdomen Pelvis W Contrast  Result Date: 06/20/2018 CLINICAL DATA:  Upper abdominal pain and nausea. History of ventral hernia. Evaluate for obstruction versus cholecystitis. EXAM: CT ABDOMEN AND PELVIS WITH CONTRAST TECHNIQUE: Multidetector CT imaging of the abdomen and pelvis was performed using the standard protocol following bolus administration of intravenous contrast. CONTRAST:  ISOVUE-300 IOPAMIDOL (ISOVUE-300) INJECTION 61% COMPARISON:  Abdominopelvic CT 08/04/2014. FINDINGS: Lower chest: Clear lung bases. No significant pleural or pericardial effusion. Hepatobiliary: The liver is normal in density without suspicious focal abnormality. No evidence of gallstones, gallbladder wall thickening or biliary dilatation. Pancreas: Unremarkable. No pancreatic ductal dilatation or surrounding inflammatory changes. Spleen: Normal in size without focal abnormality. Adrenals/Urinary Tract: Both adrenal glands appear normal. 2 mm calculus in the lower pole of the left kidney (image 35/2). Both  kidneys otherwise appear normal. No evidence of hydronephrosis or ureteral calculus. The bladder appears normal. Stomach/Bowel: No evidence of bowel wall thickening, distention or surrounding inflammatory change. Probable visualization of a normal-appearing appendix. There is prominent stool throughout the colon. Vascular/Lymphatic: There are no enlarged abdominal or pelvic lymph nodes. No significant vascular findings. Reproductive: Intrauterine device has been removed. The uterus and ovaries appear normal with a small collapsing follicle on the left. No adnexal mass. Other: Postsurgical changes in the anterior abdominal wall. No recurrent hernia. No ascites or focal extraluminal fluid collection. Musculoskeletal: No acute or significant osseous findings. Mild convex right lumbar scoliosis. IMPRESSION: 1. No acute findings or explanation for the patient's symptoms. No evidence of bowel obstruction. Prominent stool throughout the colon consistent with constipation. 2. Postsurgical  changes in the anterior abdominal wall without recurrent hernia. 3. The gallbladder appears normal by CT. 4. Tiny nonobstructing left renal calculus. Electronically Signed   By: Carey BullocksWilliam  Veazey M.D.   On: 06/20/2018 12:39    Procedures Procedures (including critical care time)  Medications Ordered in ED Medications  ondansetron (ZOFRAN) injection 4 mg (4 mg Intravenous Given 06/20/18 0942)  sodium chloride 0.9 % bolus 1,000 mL (0 mLs Intravenous Stopped 06/20/18 1128)  iopamidol (ISOVUE-300) 61 % injection 30 mL (30 mLs Oral Contrast Given 06/20/18 1030)  iopamidol (ISOVUE-300) 61 % injection 100 mL (100 mLs Intravenous Contrast Given 06/20/18 1221)     Initial Impression / Assessment and Plan / ED Course  I have reviewed the triage vital signs and the nursing notes.  Pertinent labs & imaging results that were available during my care of the patient were reviewed by me and considered in my medical decision making (see chart for  details).     -year-old female presents with concern for abdominal pain, nausea and vomiting.  CT abdomen pelvis done to evaluate small bowel obstruction, showed no acute abnormalities including normal gallbladder, no cholelithiasis or signs of cholecystitis.  Labs show no sign of pancreatitis, no transaminitis.  Discussed given large stool burden on CT, symptoms may represent constipation, but may also represent viral infection and gastritis.  Also discussed possibility of symptomatic cholelithiasis with inability to see gallstone on CT, however given patient afebrile, no signs of cholecystitis on CT, no leukocytosis, and normal hepatic labs, have low suspicion for cholecystitis.  Recommend follow-up with primary care physician, consideration of outpatient right upper quadrant ultrasound if she has another episode of pain, return to the emergency department if she has severe right upper quadrant pain, fever, vomiting or other concerns. No pelvic concerns, doubt ectopic pregnancy, PID, TOA, torsion.  Given rx for protonix. Patient discharged in stable condition with understanding of reasons to return.   Final Clinical Impressions(s) / ED Diagnoses   Final diagnoses:  Epigastric abdominal pain    ED Discharge Orders         Ordered    pantoprazole (PROTONIX) 20 MG tablet  Daily     06/20/18 1329           Alvira MondaySchlossman, Shawneequa Baldridge, MD 06/20/18 [phone]

## 2018-07-04 ENCOUNTER — Other Ambulatory Visit: Payer: Self-pay

## 2018-07-04 ENCOUNTER — Emergency Department (HOSPITAL_BASED_OUTPATIENT_CLINIC_OR_DEPARTMENT_OTHER): Payer: Medicaid Other

## 2018-07-04 ENCOUNTER — Encounter (HOSPITAL_BASED_OUTPATIENT_CLINIC_OR_DEPARTMENT_OTHER): Payer: Self-pay | Admitting: Emergency Medicine

## 2018-07-04 ENCOUNTER — Emergency Department (HOSPITAL_BASED_OUTPATIENT_CLINIC_OR_DEPARTMENT_OTHER)
Admission: EM | Admit: 2018-07-04 | Discharge: 2018-07-04 | Disposition: A | Discharge status: Home / Self Care | Payer: Medicaid Other | Attending: Emergency Medicine | Admitting: Emergency Medicine

## 2018-07-04 DIAGNOSIS — Z79899 Other long term (current) drug therapy: Secondary | ICD-10-CM | POA: Insufficient documentation

## 2018-07-04 DIAGNOSIS — F17210 Nicotine dependence, cigarettes, uncomplicated: Secondary | ICD-10-CM | POA: Diagnosis not present

## 2018-07-04 DIAGNOSIS — F1290 Cannabis use, unspecified, uncomplicated: Secondary | ICD-10-CM | POA: Insufficient documentation

## 2018-07-04 DIAGNOSIS — R002 Palpitations: Secondary | ICD-10-CM

## 2018-07-04 DIAGNOSIS — E876 Hypokalemia: Secondary | ICD-10-CM | POA: Insufficient documentation

## 2018-07-04 LAB — CBC WITH DIFFERENTIAL/PLATELET
Abs Immature Granulocytes: 0.01 10*3/uL (ref 0.00–0.07)
BASOS PCT: 1 %
Basophils Absolute: 0 10*3/uL (ref 0.0–0.1)
Eosinophils Absolute: 0 10*3/uL (ref 0.0–0.5)
Eosinophils Relative: 0 %
HCT: 34 % — ABNORMAL LOW (ref 36.0–46.0)
Hemoglobin: 10.8 g/dL — ABNORMAL LOW (ref 12.0–15.0)
Immature Granulocytes: 0 %
Lymphocytes Relative: 36 %
Lymphs Abs: 1.5 10*3/uL (ref 0.7–4.0)
MCH: 29.7 pg (ref 26.0–34.0)
MCHC: 31.8 g/dL (ref 30.0–36.0)
MCV: 93.4 fL (ref 80.0–100.0)
Monocytes Absolute: 0.4 10*3/uL (ref 0.1–1.0)
Monocytes Relative: 10 %
Neutro Abs: 2.2 10*3/uL (ref 1.7–7.7)
Neutrophils Relative %: 53 %
Platelets: 211 10*3/uL (ref 150–400)
RBC: 3.64 MIL/uL — AB (ref 3.87–5.11)
RDW: 14.6 % (ref 11.5–15.5)
WBC: 4.1 10*3/uL (ref 4.0–10.5)
nRBC: 0 % (ref 0.0–0.2)

## 2018-07-04 LAB — BASIC METABOLIC PANEL
Anion gap: 9 (ref 5–15)
BUN: 9 mg/dL (ref 6–20)
CO2: 24 mmol/L (ref 22–32)
Calcium: 8.8 mg/dL — ABNORMAL LOW (ref 8.9–10.3)
Chloride: 103 mmol/L (ref 98–111)
Creatinine, Ser: 0.65 mg/dL (ref 0.44–1.00)
GFR calc Af Amer: >60 mL/min (ref >60)
GFR calc non Af Amer: >60 mL/min (ref >60)
GLUCOSE: 121 mg/dL — AB (ref 70–99)
Potassium: 2.8 mmol/L — ABNORMAL LOW (ref 3.5–5.1)
Sodium: 136 mmol/L (ref 135–145)

## 2018-07-04 LAB — RAPID URINE DRUG SCREEN, HOSP PERFORMED
Amphetamines: NOT DETECTED
Barbiturates: NOT DETECTED
Benzodiazepines: NOT DETECTED
COCAINE: NOT DETECTED
Opiates: NOT DETECTED
Tetrahydrocannabinol: POSITIVE — AB

## 2018-07-04 LAB — TROPONIN I: Troponin I: <0.03 ng/mL (ref <0.03)

## 2018-07-04 LAB — PREGNANCY, URINE: Preg Test, Ur: NEGATIVE

## 2018-07-04 MED ORDER — POTASSIUM CHLORIDE ER 20 MEQ PO TBCR: 20.0000 meq | EXTENDED_RELEASE_TABLET | Freq: Two times a day (BID) | ORAL | 0 refills | Status: DC

## 2018-07-04 MED ORDER — POTASSIUM CHLORIDE CRYS ER 20 MEQ PO TBCR
80.0000 meq | EXTENDED_RELEASE_TABLET | Freq: Once | ORAL | Status: AC
  Administered 2018-07-04: 80 meq via ORAL
  Filled 2018-07-04: qty 4

## 2018-07-04 NOTE — ED Triage Notes (Signed)
Pt reports "my heart is beating through my ribs." pt reports 2 episodes of palpitations with associated shob.

## 2018-07-04 NOTE — ED Provider Notes (Addendum)
[facility] EMERGENCY DEPARTMENT Provider Note   CSN: [id number] Arrival date & time: 07/04/18  1735     History   Chief Complaint Chief Complaint  Patient presents with  . Palpitations    HPI Kelli Bowman is a 33 y.o. female.  The history is provided by the patient.  Palpitations  Palpitations quality:  Regular Onset quality:  Sudden Timing:  Constant Progression:  Unchanged Chronicity:  Recurrent Context: illicit drugs   Context: not anxiety and not exercise   Relieved by:  Nothing Worsened by:  Nothing Ineffective treatments:  None tried Associated symptoms: no back pain, no chest pain, no chest pressure, no cough, no diaphoresis, no dizziness, no hemoptysis and no orthopnea   Risk factors: no diabetes mellitus and no hx of PE     No OCP no long car trips or plane trips no leg pain.  No surgeries.  Is not taking any medication at this time.  Started tonight while patient was smoking and trying to fall asleep when symptoms started, they are still going on at this time.  No f/c/r.    Past Medical History:  Diagnosis Date  . H. pylori infection   . Thyroid disease     There are no active problems to display for this patient.   Past Surgical History:  Procedure Laterality Date  . CESAREAN SECTION    . HERNIA REPAIR       OB History   No obstetric history on file.      Home Medications    Prior to Admission medications   Medication Sig Start Date End Date Taking? Authorizing Provider  levothyroxine (SYNTHROID, LEVOTHROID) 125 MCG tablet Take 1 tablet (125 mcg total) by mouth daily before breakfast. 06/01/18   Gerhard Munch, MD  lidocaine (XYLOCAINE) 2 % solution Use as directed 15 mLs in the mouth or throat as needed for mouth pain. 03/30/17   Maczis, Elmer Sow, PA-C  methocarbamol (ROBAXIN) 500 MG tablet Take 1 tablet (500 mg total) by mouth 2 (two) times daily. 05/18/18   Geoffery Lyons, MD  naproxen (NAPROSYN) 500 MG tablet Take 1 tablet  (500 mg total) by mouth 2 (two) times daily. 08/23/17   Petrucelli, Samantha R, PA-C  pantoprazole (PROTONIX) 20 MG tablet Take 2 tablets (40 mg total) by mouth daily for 7 days. 06/20/18 06/27/18  Alvira Monday, MD  potassium chloride 20 MEQ TBCR Take 20 mEq by mouth 2 (two) times daily. 07/04/18   Treylon Henard, MD  potassium chloride SA (K-DUR,KLOR-CON) 20 MEQ tablet Take 1 tablet (20 mEq total) by mouth 2 (two) times daily. 05/18/18   Geoffery Lyons, MD  sucralfate (CARAFATE) 1 GM/10ML suspension Take 10 mLs (1 g total) by mouth 4 (four) times daily -  with meals and at bedtime. 03/01/18   Maxwell Caul, PA-C  traMADol (ULTRAM) 50 MG tablet Take 1 tablet (50 mg total) by mouth every 6 (six) hours as needed. 03/30/17   Maczis, Elmer Sow, PA-C    Family History No family history on file.  Social History Social History   Tobacco Use  . Smoking status: Current Every Day Smoker    Packs/day: 0.50  . Smokeless tobacco: Never Used  Substance Use Topics  . Alcohol use: No  . Drug use: No     Allergies   Vicodin [hydrocodone-acetaminophen]   Review of Systems Review of Systems  Constitutional: Negative for diaphoresis and fever.  Respiratory: Negative for cough and hemoptysis.   Cardiovascular: Positive  for palpitations. Negative for chest pain, orthopnea and leg swelling.  Musculoskeletal: Negative for back pain.  Neurological: Negative for dizziness.  All other systems reviewed and are negative.    Physical Exam Updated Vital Signs BP 124/77 (BP Location: Right Arm)   Pulse 88   Temp 98.2 F (36.8 C) (Oral)   Resp 16   Ht 5\' 6"  (1.676 m)   Wt 45.4 kg   LMP 06/26/2018 (Exact Date)   SpO2 100%   BMI 16.14 kg/m   Physical Exam Vitals signs and nursing note reviewed.  Constitutional:      General: She is not in acute distress.    Appearance: Normal appearance. She is normal weight.  HENT:     Head: Normocephalic and atraumatic.     Nose: Nose normal.      Mouth/Throat:     Mouth: Mucous membranes are moist.     Pharynx: Oropharynx is clear.  Eyes:     Conjunctiva/sclera: Conjunctivae normal.     Pupils: Pupils are equal, round, and reactive to light.  Neck:     Musculoskeletal: Normal range of motion and neck supple.  Cardiovascular:     Rate and Rhythm: Normal rate and regular rhythm.     Pulses: Normal pulses.     Heart sounds: Normal heart sounds.  Pulmonary:     Effort: Pulmonary effort is normal.     Breath sounds: Normal breath sounds.  Abdominal:     General: Abdomen is flat. Bowel sounds are normal.     Tenderness: There is no abdominal tenderness.     Hernia: No hernia is present.  Musculoskeletal: Normal range of motion.        General: No tenderness.     Right lower leg: No edema.  Skin:    General: Skin is warm and dry.     Capillary Refill: Capillary refill takes less than 2 seconds.  Neurological:     General: No focal deficit present.     Mental Status: She is alert and oriented to person, place, and time.  Psychiatric:        Mood and Affect: Mood normal.        Behavior: Behavior normal.      ED Treatments / Results  Labs (all labs ordered are listed, but only abnormal results are displayed) Results for orders placed or performed during the hospital encounter of 07/04/18  CBC with Differential/Platelet  Result Value Ref Range   WBC 4.1 4.0 - 10.5 K/uL   RBC 3.64 (L) 3.87 - 5.11 MIL/uL   Hemoglobin 10.8 (L) 12.0 - 15.0 g/dL   HCT 09.834.0 (L) 11.936.0 - 14.746.0 %   MCV 93.4 80.0 - 100.0 fL   MCH 29.7 26.0 - 34.0 pg   MCHC 31.8 30.0 - 36.0 g/dL   RDW 82.914.6 56.211.5 - 13.015.5 %   Platelets 211 150 - 400 K/uL   nRBC 0.0 0.0 - 0.2 %   Neutrophils Relative % 53 %   Neutro Abs 2.2 1.7 - 7.7 K/uL   Lymphocytes Relative 36 %   Lymphs Abs 1.5 0.7 - 4.0 K/uL   Monocytes Relative 10 %   Monocytes Absolute 0.4 0.1 - 1.0 K/uL   Eosinophils Relative 0 %   Eosinophils Absolute 0.0 0.0 - 0.5 K/uL   Basophils Relative 1 %    Basophils Absolute 0.0 0.0 - 0.1 K/uL   WBC Morphology WCS    Immature Granulocytes 0 %   Abs Immature Granulocytes 0.01  0.00 - 0.07 K/uL   Ovalocytes PRESENT   Basic metabolic panel  Result Value Ref Range   Sodium 136 135 - 145 mmol/L   Potassium 2.8 (L) 3.5 - 5.1 mmol/L   Chloride 103 98 - 111 mmol/L   CO2 24 22 - 32 mmol/L   Glucose, Bld 121 (H) 70 - 99 mg/dL   BUN 9 6 - 20 mg/dL   Creatinine, Ser 2.81 0.44 - 1.00 mg/dL   Calcium 8.8 (L) 8.9 - 10.3 mg/dL   GFR calc non Af Amer >60 >60 mL/min   GFR calc Af Amer >60 >60 mL/min   Anion gap 9 5 - 15  Troponin I - ONCE - STAT  Result Value Ref Range   Troponin I <0.03 <0.03 ng/mL  Pregnancy, urine  Result Value Ref Range   Preg Test, Ur NEGATIVE NEGATIVE  Rapid urine drug screen (hospital performed)  Result Value Ref Range   Opiates NONE DETECTED NONE DETECTED   Cocaine NONE DETECTED NONE DETECTED   Benzodiazepines NONE DETECTED NONE DETECTED   Amphetamines NONE DETECTED NONE DETECTED   Tetrahydrocannabinol POSITIVE (A) NONE DETECTED   Barbiturates NONE DETECTED NONE DETECTED   Dg Chest 2 View  Result Date: 07/04/2018 CLINICAL DATA:  Heart racing. EXAM: CHEST - 2 VIEW COMPARISON:  None. FINDINGS: Normal heart size and mediastinal contours. Symmetric nodular densities from nipple shadows and EKG pads. No acute infiltrate or edema. No effusion or pneumothorax. No acute osseous findings. IMPRESSION: Negative chest. Electronically Signed   By: Marnee Spring M.D.   On: 07/04/2018 06:14   Ct Abdomen Pelvis W Contrast  Result Date: 06/20/2018 CLINICAL DATA:  Upper abdominal pain and nausea. History of ventral hernia. Evaluate for obstruction versus cholecystitis. EXAM: CT ABDOMEN AND PELVIS WITH CONTRAST TECHNIQUE: Multidetector CT imaging of the abdomen and pelvis was performed using the standard protocol following bolus administration of intravenous contrast. CONTRAST:  ISOVUE-300 IOPAMIDOL (ISOVUE-300) INJECTION 61%  COMPARISON:  Abdominopelvic CT 08/04/2014. FINDINGS: Lower chest: Clear lung bases. No significant pleural or pericardial effusion. Hepatobiliary: The liver is normal in density without suspicious focal abnormality. No evidence of gallstones, gallbladder wall thickening or biliary dilatation. Pancreas: Unremarkable. No pancreatic ductal dilatation or surrounding inflammatory changes. Spleen: Normal in size without focal abnormality. Adrenals/Urinary Tract: Both adrenal glands appear normal. 2 mm calculus in the lower pole of the left kidney (image 35/2). Both kidneys otherwise appear normal. No evidence of hydronephrosis or ureteral calculus. The bladder appears normal. Stomach/Bowel: No evidence of bowel wall thickening, distention or surrounding inflammatory change. Probable visualization of a normal-appearing appendix. There is prominent stool throughout the colon. Vascular/Lymphatic: There are no enlarged abdominal or pelvic lymph nodes. No significant vascular findings. Reproductive: Intrauterine device has been removed. The uterus and ovaries appear normal with a small collapsing follicle on the left. No adnexal mass. Other: Postsurgical changes in the anterior abdominal wall. No recurrent hernia. No ascites or focal extraluminal fluid collection. Musculoskeletal: No acute or significant osseous findings. Mild convex right lumbar scoliosis. IMPRESSION: 1. No acute findings or explanation for the patient's symptoms. No evidence of bowel obstruction. Prominent stool throughout the colon consistent with constipation. 2. Postsurgical changes in the anterior abdominal wall without recurrent hernia. 3. The gallbladder appears normal by CT. 4. Tiny nonobstructing left renal calculus. Electronically Signed   By: Carey Bullocks M.D.   On: 06/20/2018 12:39    EKG EKG Interpretation  Date/Time:  Wednesday July 04 2018 05:13:11 EST Ventricular Rate:  94  PR Interval:  182 QRS Duration: 78 QT  Interval:  342 QTC Calculation: 427 R Axis:   85 Text Interpretation:  Normal sinus rhythm Right atrial enlargement Confirmed by Kynzie Polgar ([id number]) on 07/04/2018 5:26:42 AM   Radiology Dg Chest 2 View  Result Date: 07/04/2018 CLINICAL DATA:  Heart racing. EXAM: CHEST - 2 VIEW COMPARISON:  None. FINDINGS: Normal heart size and mediastinal contours. Symmetric nodular densities from nipple shadows and EKG pads. No acute infiltrate or edema. No effusion or pneumothorax. No acute osseous findings. IMPRESSION: Negative chest. Electronically Signed   By: Marnee SpringJonathon  Watts M.D.   On: 07/04/2018 06:14    Procedures Procedures (including critical care time)  Medications Ordered in ED Medications  potassium chloride SA (K-DUR,KLOR-CON) CR tablet 80 mEq (has no administration in time range)     PERC negative wells 0 highly doubt PE in this low risk patient.  Suspect this is related to marijuana.  Patient was not having any ectopy on monitor or EKG though the palpitations were ongoing at the time of the EKG and my evaluation.      Final Clinical Impressions(s) / ED Diagnoses   Final diagnoses:  Hypokalemia  Marijuana use, continuous  Palpitations    No marijuana.  Follow up with your PMD.  Will prescribe potassium.     Return for pain, intractable cough, productive cough,fevers >100.4 unrelieved by medication, shortness of breath, intractable vomiting, or diarrhea, abdominal pain, Inability to tolerate liquids or food, cough, altered mental status or any concerns. No signs of systemic illness or infection. The patient is nontoxic-appearing on exam and vital signs are within normal limits.   I have reviewed the triage vital signs and the nursing notes. Pertinent labs &imaging results that were available during my care of the patient were reviewed by me and considered in my medical decision making (see chart for details).  After history, exam, and medical workup I feel the patient  has been appropriately medically screened and is safe for discharge home. Pertinent diagnoses were discussed with the patient. Patient was given return precautions. ED Discharge Orders         Ordered    potassium chloride 20 MEQ TBCR  2 times daily     07/04/18 0650           Angeles Zehner, MD 07/04/18 [id number]    Nicanor AlconPalumbo, Khristen Cheyney, MD 07/04/18 [id number]

## 2019-01-28 ENCOUNTER — Ambulatory Visit (INDEPENDENT_AMBULATORY_CARE_PROVIDER_SITE_OTHER)
Admission: RE | Admit: 2019-01-28 | Discharge: 2019-01-28 | Disposition: A | Discharge status: Home / Self Care | Payer: Medicaid Other | Source: Ambulatory Visit

## 2019-01-28 DIAGNOSIS — Z8619 Personal history of other infectious and parasitic diseases: Secondary | ICD-10-CM

## 2019-01-28 DIAGNOSIS — R109 Unspecified abdominal pain: Secondary | ICD-10-CM | POA: Diagnosis not present

## 2019-01-28 MED ORDER — CLARITHROMYCIN 500 MG PO TABS: 500.0000 mg | ORAL_TABLET | Freq: Two times a day (BID) | ORAL | 0 refills | Status: AC

## 2019-01-28 MED ORDER — AMOXICILLIN 500 MG PO CAPS: 1000.0000 mg | ORAL_CAPSULE | Freq: Two times a day (BID) | ORAL | 0 refills | Status: AC

## 2019-01-28 NOTE — ED Provider Notes (Signed)
[place]    Virtual Visit via Video Note:  Kelli Bowman  initiated request for Telemedicine visit with [facility]gent [facility] team. I connected with Kelli Bowman  on 01/28/2019 at 10:38 AM  for a synchronized telemedicine visit using a video enabled HIPPA compliant telemedicine application. I verified that I am speaking with Kelli Bowman  using two identifiers. Kelli Box, PA-C  was physically located in a [place] [facility] site and Kelli Bowman was located at a different location.   The limitations of evaluation and management by telemedicine as well as the availability of in-person appointments were discussed. Patient was informed that she  may incur a bill ( including co-pay) for this virtual visit encounter. Kelli Bowman  expressed understanding and gave verbal consent to proceed with virtual visit.   [id number] 01/28/19 Arrival Time: 15  CC: ABDOMINAL DISCOMFORT  SUBJECTIVE:  Kelli Bowman is a 33 y.o. female who presents with complaint of "stomach pains" for the past year.  Worsening symptoms here recently.  Denies a precipitating event, trauma.  Localizes pain to epigastric region.  Describes as intermittent and worse with eating.   Has tried protonix prescribed by gastro without relief.  Denies alleviating factors.  Reports similar symptoms in the past related to H. Pylori infection.  Complains of mild associated nausea, and constipation.    Denies fever, chills, vomiting, chest pain, SOB, diarrhea, hematochezia, melena, dysuria, difficulty urinating, increased frequency or urgency, flank pain, loss of bowel or bladder function.  No LMP recorded.  ROS: As per HPI.  All other pertinent ROS negative.     Past Medical History:  Diagnosis Date  . H. pylori infection   . Thyroid disease    Past Surgical History:  Procedure Laterality Date  . CESAREAN SECTION    . HERNIA REPAIR     Allergies  Allergen Reactions  . Vicodin  [Hydrocodone-Acetaminophen]     PT states she is not allergic to this medication    No current facility-administered medications on file prior to encounter.    Current Outpatient Medications on File Prior to Encounter  Medication Sig Dispense Refill  . levothyroxine (SYNTHROID, LEVOTHROID) 125 MCG tablet Take 1 tablet (125 mcg total) by mouth daily before breakfast. 30 tablet 0  . lidocaine (XYLOCAINE) 2 % solution Use as directed 15 mLs in the mouth or throat as needed for mouth pain. 100 mL 0  . methocarbamol (ROBAXIN) 500 MG tablet Take 1 tablet (500 mg total) by mouth 2 (two) times daily. 20 tablet 0  . naproxen (NAPROSYN) 500 MG tablet Take 1 tablet (500 mg total) by mouth 2 (two) times daily. 30 tablet 0  . pantoprazole (PROTONIX) 20 MG tablet Take 2 tablets (40 mg total) by mouth daily for 7 days. 14 tablet 0  . potassium chloride 20 MEQ TBCR Take 20 mEq by mouth 2 (two) times daily. 10 tablet 0  . potassium chloride SA (K-DUR,KLOR-CON) 20 MEQ tablet Take 1 tablet (20 mEq total) by mouth 2 (two) times daily. 14 tablet 0  . sucralfate (CARAFATE) 1 GM/10ML suspension Take 10 mLs (1 g total) by mouth 4 (four) times daily -  with meals and at bedtime. 420 mL 0  . traMADol (ULTRAM) 50 MG tablet Take 1 tablet (50 mg total) by mouth every 6 (six) hours as needed. 15 tablet 0   OBJECTIVE:  There were no vitals filed for this visit.  General appearance: alert; no distress Eyes: EOMI grossly HENT: normocephalic; atraumatic  Neck: supple with FROM Lungs: normal respiratory effort; speaking in full sentences without difficulty Extremities: moves extremities without difficulty Skin: No obvious rashes Neurologic: No facial asymmetries Psychological: alert and cooperative; normal mood and affect   ASSESSMENT & PLAN:  1. Abdominal discomfort   2. History of Helicobacter pylori infection     Meds ordered this encounter  Medications  . clarithromycin (BIAXIN) 500 MG tablet    Sig: Take 1  tablet (500 mg total) by mouth 2 (two) times daily for 14 days.    Dispense:  28 tablet    Refill:  0    Order Specific Question:   Supervising Provider    Answer:   Eustace MooreNELSON, YVONNE SUE [[id number]]  . amoxicillin (AMOXIL) 500 MG capsule    Sig: Take 2 capsules (1,000 mg total) by mouth 2 (two) times daily for 14 days.    Dispense:  56 capsule    Refill:  0    Order Specific Question:   Supervising Provider    Answer:   Eustace MooreELSON, YVONNE SUE [[id number]]   Based on symptoms and history of H. Pylori I will go ahead and treat for possible h. Pylori infection Take protonix as prescribed twice daily Clarithromycin and amoxicillin prescribed.  Take as instructed and to completion Avoid eating 2-3 hours before bed Elevate head of bed.  Avoid chocolate, caffeine, alcohol, onion, and mint prior to bed.  This relaxes the bottom part of your esophagus and can make your symptoms worse.  Follow up with gastro in 1-2 week for recheck and to ensure your symptom are improving Follow up in person or go to the ED if you have any new or worsening symptoms fever, chills, nausea, vomiting, worsening abdominal pain, blood in stool, burning with urination, symptoms do not improve with medications, etc...   I discussed the assessment and treatment plan with the patient. The patient was provided an opportunity to ask questions and all were answered. The patient agreed with the plan and demonstrated an understanding of the instructions.   The patient was advised to call back or seek an in-person evaluation if the symptoms worsen or if the condition fails to improve as anticipated.  I provided 15 minutes of non-face-to-face time during this encounter.  Rennis HardingBrittany Mattson Dayal, PA-C  01/28/2019 10:38 AM    Rennis HardingWurst, Daijon Wenke, PA-C 01/28/19 1038

## 2019-01-28 NOTE — Discharge Instructions (Signed)
Based on symptoms and history of H. Pylori I will go ahead and treat for possible h. Pylori infection Take protonix as prescribed twice daily Clarithromycine and amoxicillin prescribed.  Take as instructed and to completion Avoid eating 2-3 hours before bed Elevate head of bed.  Avoid chocolate, caffeine, alcohol, onion, and mint prior to bed.  This relaxes the bottom part of your esophagus and can make your symptoms worse.  Follow up with gastro in 1-2 week for recheck and to ensure your symptom are improving Follow up in person or go to the ED if you have any new or worsening symptoms fever, chills, nausea, vomiting, worsening abdominal pain, blood in stool, burning with urination, symptoms do not improve with medications, etc..Marland Kitchen

## 2019-04-06 ENCOUNTER — Emergency Department (HOSPITAL_BASED_OUTPATIENT_CLINIC_OR_DEPARTMENT_OTHER)
Admission: EM | Admit: 2019-04-06 | Discharge: 2019-04-06 | Disposition: A | Discharge status: Home / Self Care | Payer: Medicaid Other | Attending: Emergency Medicine | Admitting: Emergency Medicine

## 2019-04-06 ENCOUNTER — Encounter (HOSPITAL_BASED_OUTPATIENT_CLINIC_OR_DEPARTMENT_OTHER): Payer: Self-pay | Admitting: Emergency Medicine

## 2019-04-06 ENCOUNTER — Other Ambulatory Visit: Payer: Self-pay

## 2019-04-06 ENCOUNTER — Emergency Department (HOSPITAL_BASED_OUTPATIENT_CLINIC_OR_DEPARTMENT_OTHER): Payer: Medicaid Other

## 2019-04-06 DIAGNOSIS — Z885 Allergy status to narcotic agent status: Secondary | ICD-10-CM | POA: Insufficient documentation

## 2019-04-06 DIAGNOSIS — R110 Nausea: Secondary | ICD-10-CM | POA: Diagnosis not present

## 2019-04-06 DIAGNOSIS — R1084 Generalized abdominal pain: Secondary | ICD-10-CM

## 2019-04-06 DIAGNOSIS — E079 Disorder of thyroid, unspecified: Secondary | ICD-10-CM | POA: Diagnosis not present

## 2019-04-06 DIAGNOSIS — Z79899 Other long term (current) drug therapy: Secondary | ICD-10-CM | POA: Insufficient documentation

## 2019-04-06 DIAGNOSIS — F17210 Nicotine dependence, cigarettes, uncomplicated: Secondary | ICD-10-CM | POA: Insufficient documentation

## 2019-04-06 LAB — COMPREHENSIVE METABOLIC PANEL
ALT: 13 U/L (ref 0–44)
AST: 21 U/L (ref 15–41)
Albumin: 4.3 g/dL (ref 3.5–5.0)
Alkaline Phosphatase: 36 U/L — ABNORMAL LOW (ref 38–126)
Anion gap: 9 (ref 5–15)
BUN: 9 mg/dL (ref 6–20)
CO2: 24 mmol/L (ref 22–32)
Calcium: 9 mg/dL (ref 8.9–10.3)
Chloride: 103 mmol/L (ref 98–111)
Creatinine, Ser: 0.66 mg/dL (ref 0.44–1.00)
GFR calc Af Amer: >60 mL/min (ref >60)
GFR calc non Af Amer: >60 mL/min (ref >60)
Glucose, Bld: 91 mg/dL (ref 70–99)
Potassium: 3.3 mmol/L — ABNORMAL LOW (ref 3.5–5.1)
Sodium: 136 mmol/L (ref 135–145)
Total Bilirubin: 0.6 mg/dL (ref 0.3–1.2)
Total Protein: 7.2 g/dL (ref 6.5–8.1)

## 2019-04-06 LAB — HCG, SERUM, QUALITATIVE: Preg, Serum: NEGATIVE

## 2019-04-06 LAB — CBC WITH DIFFERENTIAL/PLATELET
Abs Immature Granulocytes: 0 10*3/uL (ref 0.00–0.07)
Basophils Absolute: 0 10*3/uL (ref 0.0–0.1)
Basophils Relative: 1 %
Eosinophils Absolute: 0 10*3/uL (ref 0.0–0.5)
Eosinophils Relative: 1 %
HCT: 32 % — ABNORMAL LOW (ref 36.0–46.0)
Hemoglobin: 10 g/dL — ABNORMAL LOW (ref 12.0–15.0)
Immature Granulocytes: 0 %
Lymphocytes Relative: 37 %
Lymphs Abs: 1.3 10*3/uL (ref 0.7–4.0)
MCH: 27.9 pg (ref 26.0–34.0)
MCHC: 31.3 g/dL (ref 30.0–36.0)
MCV: 89.1 fL (ref 80.0–100.0)
Monocytes Absolute: 0.4 10*3/uL (ref 0.1–1.0)
Monocytes Relative: 11 %
Neutro Abs: 1.9 10*3/uL (ref 1.7–7.7)
Neutrophils Relative %: 50 %
Platelets: 221 10*3/uL (ref 150–400)
RBC: 3.59 MIL/uL — ABNORMAL LOW (ref 3.87–5.11)
RDW: 14.9 % (ref 11.5–15.5)
WBC: 3.7 10*3/uL — ABNORMAL LOW (ref 4.0–10.5)
nRBC: 0 % (ref 0.0–0.2)

## 2019-04-06 LAB — URINALYSIS, ROUTINE W REFLEX MICROSCOPIC
Bilirubin Urine: NEGATIVE
Glucose, UA: NEGATIVE mg/dL
Hgb urine dipstick: NEGATIVE
Ketones, ur: 15 mg/dL — AB
Leukocytes,Ua: NEGATIVE
Nitrite: NEGATIVE
Protein, ur: NEGATIVE mg/dL
Specific Gravity, Urine: >1.03 — ABNORMAL HIGH (ref 1.005–1.030)
pH: 5.5 (ref 5.0–8.0)

## 2019-04-06 LAB — LIPASE, BLOOD: Lipase: 21 U/L (ref 11–51)

## 2019-04-06 MED ORDER — SODIUM CHLORIDE 0.9 % IV BOLUS
1000.0000 mL | Freq: Once | INTRAVENOUS | Status: AC
  Administered 2019-04-06: 11:00:00 1000 mL via INTRAVENOUS

## 2019-04-06 MED ORDER — ONDANSETRON HCL 4 MG/2ML IJ SOLN
4.0000 mg | Freq: Once | INTRAMUSCULAR | Status: AC
  Administered 2019-04-06: 4 mg via INTRAVENOUS
  Filled 2019-04-06: qty 2

## 2019-04-06 MED ORDER — MORPHINE SULFATE (PF) 4 MG/ML IV SOLN
4.0000 mg | Freq: Once | INTRAVENOUS | Status: DC
  Filled 2019-04-06: qty 1

## 2019-04-06 MED ORDER — ONDANSETRON 4 MG PO TBDP: 4.0000 mg | ORAL_TABLET | Freq: Three times a day (TID) | ORAL | 0 refills | Status: DC | PRN

## 2019-04-06 NOTE — ED Provider Notes (Signed)
[facility] EMERGENCY DEPARTMENT Provider Note   CSN: [id number] Arrival date & time: 04/06/19  1001    History   Chief Complaint Chief Complaint  Patient presents with  . Abdominal Pain   HPI Kelli Bowman is a 33 y.o. female past medical history significant for H pylori, hernia repair who presents for evaluation of abdominal pain.  Patient states she has had abdominal pain times 14 years.  Patient states pain worse with movement.  She has had persistent nausea without emesis which she states she has had for months.  Pain located to her diffuse abdomen worse midline.  States it does intermittently radiate around her entire abdomen.  Patient states her pain feels similar to her previous abdominal pain.  She has not been followed by general surgery for her known ventral hernia.  Denies fever, chills, headache, vision changes, chest pain, shortness of breath, pelvic pain, vaginal discharge, dysuria constipation.  She has not taken anything for her pain.  She states she does have chronic diarrhea.  Abdominal pain mainly relieved with bowel movement.  She is being followed by [facility] for chronic abdominal pain.  He was started on Bentyl with mild improvement in her symptoms.  No melena, hematochezia. She states "I am just tired of always having abdominal pain."  Patient was evaluated by [facility] general surgery who patient states he told her she was not a candidate for surgical intervention. [facility]    History obtained from patient and past medical records.  No interpreter is used.    HPI  Past Medical History:  Diagnosis Date  . H. pylori infection   . Thyroid disease     There are no active problems to display for this patient.   Past Surgical History:  Procedure Laterality Date  . CESAREAN SECTION    . HERNIA REPAIR       OB History   No obstetric history on file.      Home Medications    Prior to Admission  medications   Medication Sig Start Date End Date Taking? Authorizing Provider  levothyroxine (SYNTHROID, LEVOTHROID) 125 MCG tablet Take 1 tablet (125 mcg total) by mouth daily before breakfast. 06/01/18   Gerhard Munch, MD  lidocaine (XYLOCAINE) 2 % solution Use as directed 15 mLs in the mouth or throat as needed for mouth pain. 03/30/17   Maczis, Elmer Sow, PA-C  methocarbamol (ROBAXIN) 500 MG tablet Take 1 tablet (500 mg total) by mouth 2 (two) times daily. 05/18/18   Geoffery Lyons, MD  naproxen (NAPROSYN) 500 MG tablet Take 1 tablet (500 mg total) by mouth 2 (two) times daily. 08/23/17   Petrucelli, Samantha R, PA-C  ondansetron (ZOFRAN ODT) 4 MG disintegrating tablet Take 1 tablet (4 mg total) by mouth every 8 (eight) hours as needed for nausea or vomiting. 04/06/19   ,  A, PA-C  pantoprazole (PROTONIX) 20 MG tablet Take 2 tablets (40 mg total) by mouth daily for 7 days. 06/20/18 06/27/18  Alvira Monday, MD  potassium chloride 20 MEQ TBCR Take 20 mEq by mouth 2 (two) times daily. 07/04/18   Palumbo, April, MD  potassium chloride SA (K-DUR,KLOR-CON) 20 MEQ tablet Take 1 tablet (20 mEq total) by mouth 2 (two) times daily. 05/18/18   Geoffery Lyons, MD  sucralfate (CARAFATE) 1 GM/10ML suspension Take 10 mLs (1 g total) by mouth 4 (four) times daily -  with meals and at bedtime. 03/01/18   Layden,  Lindsey A, PA-C  traMADol (ULTRAM) 50 MG tablet Take 1 tablet (50 mg total) by mouth every 6 (six) hours as needed. 03/30/17   Maczis, Elmer SowMichael M, PA-C   Family History History reviewed. No pertinent family history.  Social History Social History   Tobacco Use  . Smoking status: Current Every Day Smoker    Packs/day: 0.50  . Smokeless tobacco: Never Used  Substance Use Topics  . Alcohol use: No  . Drug use: No   Allergies   Vicodin [hydrocodone-acetaminophen]  Review of Systems Review of Systems  Constitutional: Negative.   HENT: Negative.   Respiratory: Negative.    Cardiovascular: Negative.   Gastrointestinal: Positive for abdominal pain, diarrhea (Chronic), nausea and vomiting. Negative for abdominal distention, anal bleeding, blood in stool, constipation and rectal pain.  Genitourinary: Negative.   Musculoskeletal: Negative.   Skin: Negative.   Neurological: Negative.   All other systems reviewed and are negative.  Physical Exam Updated Vital Signs BP 114/82 (BP Location: Left Arm)   Pulse 66   Temp 98.4 F (36.9 C) (Oral)   Resp 16   LMP 03/27/2019   SpO2 99%   Physical Exam Vitals signs and nursing note reviewed.  Constitutional:      General: She is not in acute distress.    Appearance: She is well-developed. She is not ill-appearing, toxic-appearing or diaphoretic.  HENT:     Head: Normocephalic and atraumatic.     Mouth/Throat:     Mouth: Mucous membranes are moist.  Eyes:     Pupils: Pupils are equal, round, and reactive to light.  Neck:     Musculoskeletal: Normal range of motion.  Cardiovascular:     Rate and Rhythm: Normal rate.     Heart sounds: Normal heart sounds.  Pulmonary:     Effort: Pulmonary effort is normal. No respiratory distress.     Breath sounds: Normal breath sounds.  Abdominal:     General: Bowel sounds are normal. There is no distension.     Palpations: Abdomen is soft.     Tenderness: There is generalized abdominal tenderness. There is no right CVA tenderness, left CVA tenderness, guarding or rebound. Negative signs include Murphy's sign and McBurney's sign.     Hernia: A hernia is present. Hernia is present in the ventral area.     Comments: Generalized abdominal tenderness worse to periumbilical region.  She does have ventral abdominal wall hernia.  No evidence of strangulated or incarcerated hernia  Musculoskeletal: Normal range of motion.     Comments: Moves all 4 extremities without difficulty.  Skin:    General: Skin is warm and dry.     Capillary Refill: Capillary refill takes less than 2  seconds.     Comments: Brisk capillary refill.  No rashes or lesions.  Neurological:     Mental Status: She is alert.    ED Treatments / Results  Labs (all labs ordered are listed, but only abnormal results are displayed) Labs Reviewed  CBC WITH DIFFERENTIAL/PLATELET - Abnormal; Notable for the following components:      Result Value   WBC 3.7 (*)    RBC 3.59 (*)    Hemoglobin 10.0 (*)    HCT 32.0 (*)    All other components within normal limits  COMPREHENSIVE METABOLIC PANEL - Abnormal; Notable for the following components:   Potassium 3.3 (*)    Alkaline Phosphatase 36 (*)    All other components within normal limits  URINALYSIS, ROUTINE W REFLEX  MICROSCOPIC - Abnormal; Notable for the following components:   Specific Gravity, Urine >1.030 (*)    Ketones, ur 15 (*)    All other components within normal limits  LIPASE, BLOOD  HCG, SERUM, QUALITATIVE    EKG None  Radiology Dg Abdomen Acute W/chest  Result Date: 04/06/2019 CLINICAL DATA:  Lower abdominal pain, chronic, worsening. History of umbilical hernia repair. EXAM: DG ABDOMEN ACUTE W/ 1V CHEST COMPARISON:  07/04/2018 chest radiograph. 02/13/2018 abdominal radiograph FINDINGS: Stable cardiomediastinal silhouette with normal heart size. No pneumothorax. No pleural effusion. Lungs appear clear, with no acute consolidative airspace disease and no pulmonary edema. No disproportionately dilated small bowel loops. No evidence of pneumatosis or pneumoperitoneum. Mild colonic stool volume. Stable punctate calcification overlying the lower left kidney. IMPRESSION: 1. No active cardiopulmonary disease. 2. Nonobstructive bowel gas pattern. 3. Stable punctate calcification overlying the lower left kidney, cannot exclude tiny left renal stone. Electronically Signed   By: Ilona Sorrel M.D.   On: 04/06/2019 13:53    Procedures Procedures (including critical care time)  Medications Ordered in ED Medications  morphine 4 MG/ML injection  4 mg (4 mg Intravenous Refused 04/06/19 1107)  sodium chloride 0.9 % bolus 1,000 mL (0 mLs Intravenous Stopped 04/06/19 1309)  ondansetron (ZOFRAN) injection 4 mg (4 mg Intravenous Given 04/06/19 1106)   Initial Impression / Assessment and Plan / ED Course  I have reviewed the triage vital signs and the nursing notes.  Pertinent labs & imaging results that were available during my care of the patient were reviewed by me and considered in my medical decision making (see chart for details).  33 year old female appears otherwise well presents for evaluation of acute on chronic abdominal pain.  Feels this is related to her old hernia surgery.  Pain has been intermittent over the last 14 years.  Has felt persistent nausea without emesis.  Her lungs clear.  She can tolerating p.o. intake at home.  Abdomen generally tender to palpation worst at periumbilical region.  She does have a large ventral abdominal wall hernia without obvious strangulation or incarceration.  She has a negative McBurney point.  She has no pelvic symptoms to suggest STD, TOA, torsion, PID.  Will obtain labs, IV fluids, pain meds and reevaluate.  She states "I am just tired of always having abdominal pain."  CBC with leukopenia at 3.7, Hgb 10.0 stable from previous CMP without significant joint, renal or liver abnormality Lipase 21 hCG negative  Reevaluation abdomen soft.  Nonsurgical abdomen.  No evidence of incarcerated or strangulated hernia.  Given IV fluids and Zofran.  She refused pain medicine.  She is talking on the phone initial evaluation sitting up in no acute distress.  Plain film abdomen/chest without acute findings  Do not think patient needs CT scan at this time given her chronic abdominal pain.  States this feels similar to previous.  She is tolerating p.o. intake in ED without difficulty.  Given this is chronic pain do not feel patient needs home narcotics.  Will DC home with recommendations to follow-up with GI and  general surgery.  No pelvic complaints, vaginal discharge to suggest pelvic etiology of pain.  Patient is nontoxic, nonseptic appearing, in no apparent distress.  Patient's pain and other symptoms adequately managed in emergency department.  Fluid bolus given.  Labs, imaging and vitals reviewed.  Patient does not meet the SIRS or Sepsis criteria.  On repeat exam patient does not have a surgical abdomin and there are no peritoneal signs.  No indication of appendicitis, bowel obstruction, bowel perforation, cholecystitis, hernia incarceration/ strangulation, diverticulitis, PID or ectopic pregnancy.  Patient discharged home with symptomatic treatment and given strict instructions for follow-up with their primary care physician.  I have also discussed reasons to return immediately to the ER.  Patient expresses understanding and agrees with plan.         Final Clinical Impressions(s) / ED Diagnoses   Final diagnoses:  Generalized abdominal pain  Chronic nausea    ED Discharge Orders         Ordered    Ambulatory referral to Gastroenterology     04/06/19 1404    ondansetron (ZOFRAN ODT) 4 MG disintegrating tablet  Every 8 hours PRN     04/06/19 1404           ,  A, PA-C 04/06/19 1406    Little, Ambrose Finland, MD 04/07/19 [phone]

## 2019-04-06 NOTE — ED Triage Notes (Signed)
Pt here with pain from hernia repair. States she has had pain for 14 years and it's too much.

## 2019-04-06 NOTE — Discharge Instructions (Signed)
Zofran as needed for nausea.  If you did not hear from [facility] [place] [facility] by Tuesday please call the number listed in your discharge paperwork.

## 2019-04-06 NOTE — ED Notes (Signed)
Pt ambulatory to d/c window with steady gait. Verbalized understanding that RX has been sent to pharmacy listed on d/c paperwork

## 2019-04-06 NOTE — ED Notes (Signed)
Pt refuses to urinate for test.

## 2019-04-08 ENCOUNTER — Inpatient Hospital Stay
Admission: RE | Admit: 2019-04-08 | Discharge: 2019-04-08 | Disposition: A | Discharge status: Home / Self Care | Payer: Medicaid Other | Source: Ambulatory Visit

## 2019-04-27 ENCOUNTER — Encounter (HOSPITAL_BASED_OUTPATIENT_CLINIC_OR_DEPARTMENT_OTHER): Payer: Self-pay | Admitting: Emergency Medicine

## 2019-04-27 ENCOUNTER — Other Ambulatory Visit: Payer: Self-pay

## 2019-04-27 ENCOUNTER — Emergency Department (HOSPITAL_BASED_OUTPATIENT_CLINIC_OR_DEPARTMENT_OTHER)
Admission: EM | Admit: 2019-04-27 | Discharge: 2019-04-27 | Disposition: A | Discharge status: Home / Self Care | Payer: Medicaid Other | Attending: Emergency Medicine | Admitting: Emergency Medicine

## 2019-04-27 DIAGNOSIS — M79605 Pain in left leg: Secondary | ICD-10-CM | POA: Diagnosis present

## 2019-04-27 DIAGNOSIS — Z5321 Procedure and treatment not carried out due to patient leaving prior to being seen by health care provider: Secondary | ICD-10-CM | POA: Diagnosis not present

## 2019-04-27 NOTE — ED Triage Notes (Signed)
L calf and thigh pain x 2 days. Reports hx of thyroid disease.

## 2019-04-27 NOTE — ED Notes (Signed)
Called multiple times for room, no answer  

## 2019-04-27 NOTE — ED Notes (Signed)
No answer when called to room

## 2019-04-28 ENCOUNTER — Encounter (HOSPITAL_BASED_OUTPATIENT_CLINIC_OR_DEPARTMENT_OTHER): Payer: Self-pay | Admitting: Emergency Medicine

## 2019-04-28 ENCOUNTER — Emergency Department (HOSPITAL_BASED_OUTPATIENT_CLINIC_OR_DEPARTMENT_OTHER)
Admission: EM | Admit: 2019-04-28 | Discharge: 2019-04-28 | Disposition: A | Discharge status: Home / Self Care | Payer: Medicaid Other | Attending: Emergency Medicine | Admitting: Emergency Medicine

## 2019-04-28 ENCOUNTER — Other Ambulatory Visit: Payer: Self-pay

## 2019-04-28 DIAGNOSIS — F17210 Nicotine dependence, cigarettes, uncomplicated: Secondary | ICD-10-CM | POA: Insufficient documentation

## 2019-04-28 DIAGNOSIS — R252 Cramp and spasm: Secondary | ICD-10-CM | POA: Insufficient documentation

## 2019-04-28 MED ORDER — METHOCARBAMOL 500 MG PO TABS: 500.0000 mg | ORAL_TABLET | Freq: Two times a day (BID) | ORAL | 0 refills | Status: AC

## 2019-04-28 MED ORDER — METHOCARBAMOL 500 MG PO TABS: 500.0000 mg | ORAL_TABLET | Freq: Two times a day (BID) | ORAL | 0 refills | Status: DC

## 2019-04-28 NOTE — ED Triage Notes (Addendum)
Reports cramping in left leg for the last three days unrelieved by tylenol.  Patient reports history of the same usually relieved taking muscle relaxers but is currently out.

## 2019-04-28 NOTE — ED Provider Notes (Signed)
[facility] EMERGENCY DEPARTMENT Provider Note  CSN: [id number] Arrival date & time: 04/28/19 7026  Chief Complaint(s) Leg Pain  HPI Kelli Bowman is a 33 y.o. female here with left leg muscle spasms usually felt at night. Improves throughout the day with activity. Also improves with heat. Similar to past episodes. No trauma. No h/o DVT or PE.   HPI  Past Medical History Past Medical History:  Diagnosis Date   H. pylori infection    Thyroid disease    There are no active problems to display for this patient.  Home Medication(s) Prior to Admission medications   Medication Sig Start Date End Date Taking? Authorizing Provider  ondansetron (ZOFRAN ODT) 4 MG disintegrating tablet Take 1 tablet (4 mg total) by mouth every 8 (eight) hours as needed for nausea or vomiting. 04/06/19  Yes Henderly, Britni A, PA-C  levothyroxine (SYNTHROID, LEVOTHROID) 125 MCG tablet Take 1 tablet (125 mcg total) by mouth daily before breakfast. Patient taking differently: Take 125 mcg by mouth daily before breakfast.  06/01/18   Gerhard Munch, MD  lidocaine (XYLOCAINE) 2 % solution Use as directed 15 mLs in the mouth or throat as needed for mouth pain. 03/30/17   Maczis, Elmer Sow, PA-C  methocarbamol (ROBAXIN) 500 MG tablet Take 1 tablet (500 mg total) by mouth 2 (two) times daily. 04/28/19   Nira Conn, MD  naproxen (NAPROSYN) 500 MG tablet Take 1 tablet (500 mg total) by mouth 2 (two) times daily. 08/23/17   Petrucelli, Samantha R, PA-C  pantoprazole (PROTONIX) 20 MG tablet Take 2 tablets (40 mg total) by mouth daily for 7 days. 06/20/18 06/27/18  Alvira Monday, MD  potassium chloride 20 MEQ TBCR Take 20 mEq by mouth 2 (two) times daily. 07/04/18   Palumbo, April, MD  potassium chloride SA (K-DUR,KLOR-CON) 20 MEQ tablet Take 1 tablet (20 mEq total) by mouth 2 (two) times daily. 05/18/18   Geoffery Lyons, MD  sucralfate (CARAFATE) 1 GM/10ML suspension Take 10 mLs (1 g total) by  mouth 4 (four) times daily -  with meals and at bedtime. 03/01/18   Maxwell Caul, PA-C  traMADol (ULTRAM) 50 MG tablet Take 1 tablet (50 mg total) by mouth every 6 (six) hours as needed. 03/30/17   Maczis, Elmer Sow, PA-C                                                                                                                                    Past Surgical History Past Surgical History:  Procedure Laterality Date   CESAREAN SECTION     HERNIA REPAIR     Family History No family history on file.  Social History Social History   Tobacco Use   Smoking status: Current Every Day Smoker    Packs/day: 0.50   Smokeless tobacco: Never Used  Substance Use Topics   Alcohol use: No   Drug use: No   Allergies  Vicodin [hydrocodone-acetaminophen]  Review of Systems Review of Systems All other systems are reviewed and are negative for acute change except as noted in the HPI  Physical Exam Vital Signs  I have reviewed the triage vital signs BP 135/89 (BP Location: Right Arm)    Pulse 75    Temp 98.1 F (36.7 C) (Oral)    Resp 18    Ht 5\' 7"  (1.702 m)    Wt 49 kg    LMP 04/26/2019    SpO2 100%    BMI 16.92 kg/m   Physical Exam Vitals signs reviewed.  Constitutional:      General: She is not in acute distress.    Appearance: She is well-developed. She is not diaphoretic.  HENT:     Head: Normocephalic and atraumatic.     Right Ear: External ear normal.     Left Ear: External ear normal.     Nose: Nose normal.  Eyes:     General: No scleral icterus.    Conjunctiva/sclera: Conjunctivae normal.  Neck:     Musculoskeletal: Normal range of motion.     Trachea: Phonation normal.  Cardiovascular:     Rate and Rhythm: Normal rate and regular rhythm.     Pulses:          Posterior tibial pulses are 2+ on the right side and 2+ on the left side.  Pulmonary:     Effort: Pulmonary effort is normal. No respiratory distress.     Breath sounds: No stridor.  Abdominal:      General: There is no distension.  Musculoskeletal: Normal range of motion.     Right lower leg: No edema.     Left lower leg: No edema.  Neurological:     Mental Status: She is alert and oriented to person, place, and time.  Psychiatric:        Behavior: Behavior normal.     ED Results and Treatments Labs (all labs ordered are listed, but only abnormal results are displayed) Labs Reviewed - No data to display                                                                                                                       EKG  EKG Interpretation  Date/Time:    Ventricular Rate:    PR Interval:    QRS Duration:   QT Interval:    QTC Calculation:   R Axis:     Text Interpretation:        Radiology No results found.  Pertinent labs & imaging results that were available during my care of the patient were reviewed by me and considered in my medical decision making (see chart for details).  Medications Ordered in ED Medications - No data to display  Procedures Procedures  (including critical care time)  Medical Decision Making / ED Course I have reviewed the nursing notes for this encounter and the patient's prior records (if available in EHR or on provided paperwork).   Kelli Bowman was evaluated in Emergency Department on 04/28/2019 for the symptoms described in the history of present illness. She was evaluated in the context of the global COVID-19 pandemic, which necessitated consideration that the patient might be at risk for infection with the SARS-CoV-2 virus that causes COVID-19. Institutional protocols and algorithms that pertain to the evaluation of patients at risk for COVID-19 are in a state of rapid change based on information released by regulatory bodies including the CDC and federal and state organizations. These policies and  algorithms were followed during the patient's care in the ED.  Muscle cramps. H/o hypokalemia. No evidence suggestive of DVT or arterial occlusion.   Supportive management recommended.  The patient appears reasonably screened and/or stabilized for discharge and I doubt any other medical condition or other [facility] requiring further screening, evaluation, or treatment in the ED at this time prior to discharge.  The patient is safe for discharge with strict return precautions.       Final Clinical Impression(s) / ED Diagnoses Final diagnoses:  Leg cramp     The patient appears reasonably screened and/or stabilized for discharge and I doubt any other medical condition or other [facility] requiring further screening, evaluation, or treatment in the ED at this time prior to discharge.  Disposition: Discharge  Condition: Good  I have discussed the results, Dx and Tx plan with the patient who expressed understanding and agree(s) with the plan. Discharge instructions discussed at great length. The patient was given strict return precautions who verbalized understanding of the instructions. No further questions at time of discharge.    ED Discharge Orders         Ordered    methocarbamol (ROBAXIN) 500 MG tablet  2 times daily,   Status:  Discontinued     04/28/19 0409            Follow Up: Inc, [facility] [address] [place] [address] [phone]  Schedule an appointment as soon as possible for a visit  in 5-7 days, If symptoms do not improve or  worsen     This chart was dictated using voice recognition software.  Despite best efforts to proofread,  errors can occur which can change the documentation meaning.   Fatima Blank, MD 04/28/19 [phone]

## 2019-05-14 ENCOUNTER — Telehealth: Payer: Medicaid Other | Admitting: Physician Assistant

## 2019-05-14 DIAGNOSIS — R0981 Nasal congestion: Secondary | ICD-10-CM | POA: Diagnosis not present

## 2019-05-14 MED ORDER — FLUTICASONE PROPIONATE 50 MCG/ACT NA SUSP: 2.0000 | Freq: Every day | NASAL | 6 refills | Status: DC

## 2019-05-14 NOTE — Progress Notes (Signed)
E visit for Rhinitis We are sorry that you are not feeling well.  Here is how we plan to help!  Based on what you have shared with me it looks like you have Rhinitis.  Rhinitis is when a reaction occurs that causes nasal congestion, runny nose, sneezing, and itching.  Most types of rhinitis are caused by an inflammation and are associated with symptoms in the eyes ears or throat. There are several types of rhinitis.  The most common are acute rhinitis, which is usually caused by a viral illness, allergic or seasonal rhinitis, and nonallergic or year-round rhinitis.  Nasal allergies occur certain times of the year.  Allergic rhinitis is caused when allergens in the air trigger the release of histamine in the body.  Histamine causes itching, swelling, and fluid to build up in the fragile linings of the nasal passages, sinuses and eyelids.  An itchy nose and clear discharge are common.  I recommend the following over the counter treatments: You should take a daily dose of antihistamine and Xyzal 5 mg take 1 tablet daily  I also would recommend a nasal spray: Flonase 2 sprays into each nostril once daily and Saline 1 spray into each nostril as needed   HOME CARE:   You can use an over-the-counter saline nasal spray as needed  Avoid areas where there is heavy dust, mites, or molds  Stay indoors on windy days during the pollen season  Keep windows closed in home, at least in bedroom; use air conditioner.  Use high-efficiency house air filter  Keep windows closed in car, turn AC on re-circulate  Avoid playing out with dog during pollen season  GET HELP RIGHT AWAY IF:   If your symptoms do not improve within 10 days  You become short of breath  You develop yellow or green discharge from your nose for over 3 days  You have coughing fits  MAKE SURE YOU:   Understand these instructions  Will watch your condition  Will get help right away if you are not doing well or get  worse  Thank you for choosing an e-visit. Your e-visit answers were reviewed by a board certified advanced clinical practitioner to complete your personal care plan. Depending upon the condition, your plan could have included both over the counter or prescription medications. Please review your pharmacy choice. Be sure that the pharmacy you have chosen is open so that you can pick up your prescription now.  If there is a problem you may message your provider in [place] to have the prescription routed to another pharmacy. Your safety is important to [place]. If you have drug allergies check your prescription carefully.  For the next 24 hours, you can use MyChart to ask questions about today's visit, request a non-urgent call back, or ask for a work or school excuse from your e-visit provider. You will get an email in the next two days asking about your experience. I hope that your e-visit has been valuable and will speed your recovery.  Greater than 5 minutes, yet less than 10 minutes of time have been spent researching, coordinating and implementing care for this patient today.

## 2019-05-24 ENCOUNTER — Inpatient Hospital Stay
Admission: RE | Admit: 2019-05-24 | Discharge: 2019-05-24 | Disposition: A | Discharge status: Home / Self Care | Payer: Medicaid Other | Source: Ambulatory Visit

## 2019-07-09 ENCOUNTER — Other Ambulatory Visit: Payer: Self-pay

## 2019-07-09 ENCOUNTER — Emergency Department (HOSPITAL_BASED_OUTPATIENT_CLINIC_OR_DEPARTMENT_OTHER)
Admission: EM | Admit: 2019-07-09 | Discharge: 2019-07-09 | Disposition: A | Discharge status: Home / Self Care | Payer: Medicaid Other | Attending: Emergency Medicine | Admitting: Emergency Medicine

## 2019-07-09 ENCOUNTER — Encounter (HOSPITAL_BASED_OUTPATIENT_CLINIC_OR_DEPARTMENT_OTHER): Payer: Self-pay | Admitting: *Deleted

## 2019-07-09 ENCOUNTER — Emergency Department (HOSPITAL_BASED_OUTPATIENT_CLINIC_OR_DEPARTMENT_OTHER): Payer: Medicaid Other

## 2019-07-09 DIAGNOSIS — R42 Dizziness and giddiness: Secondary | ICD-10-CM | POA: Insufficient documentation

## 2019-07-09 DIAGNOSIS — R0789 Other chest pain: Secondary | ICD-10-CM

## 2019-07-09 DIAGNOSIS — F1490 Cocaine use, unspecified, uncomplicated: Secondary | ICD-10-CM | POA: Diagnosis not present

## 2019-07-09 DIAGNOSIS — Z79899 Other long term (current) drug therapy: Secondary | ICD-10-CM | POA: Diagnosis not present

## 2019-07-09 DIAGNOSIS — R1084 Generalized abdominal pain: Secondary | ICD-10-CM | POA: Diagnosis not present

## 2019-07-09 DIAGNOSIS — E876 Hypokalemia: Secondary | ICD-10-CM | POA: Insufficient documentation

## 2019-07-09 DIAGNOSIS — R110 Nausea: Secondary | ICD-10-CM | POA: Insufficient documentation

## 2019-07-09 DIAGNOSIS — F17210 Nicotine dependence, cigarettes, uncomplicated: Secondary | ICD-10-CM | POA: Diagnosis not present

## 2019-07-09 DIAGNOSIS — G8929 Other chronic pain: Secondary | ICD-10-CM | POA: Diagnosis not present

## 2019-07-09 LAB — URINALYSIS, ROUTINE W REFLEX MICROSCOPIC
Bilirubin Urine: NEGATIVE
Glucose, UA: NEGATIVE mg/dL
Hgb urine dipstick: NEGATIVE
Ketones, ur: NEGATIVE mg/dL
Leukocytes,Ua: NEGATIVE
Nitrite: NEGATIVE
Protein, ur: NEGATIVE mg/dL
Specific Gravity, Urine: <1.005 — ABNORMAL LOW (ref 1.005–1.030)
pH: 6 (ref 5.0–8.0)

## 2019-07-09 LAB — CBC WITH DIFFERENTIAL/PLATELET
Abs Immature Granulocytes: 0.01 10*3/uL (ref 0.00–0.07)
Basophils Absolute: 0 10*3/uL (ref 0.0–0.1)
Basophils Relative: 1 %
Eosinophils Absolute: 0 10*3/uL (ref 0.0–0.5)
Eosinophils Relative: 0 %
HCT: 35.4 % — ABNORMAL LOW (ref 36.0–46.0)
Hemoglobin: 11.1 g/dL — ABNORMAL LOW (ref 12.0–15.0)
Immature Granulocytes: 0 %
Lymphocytes Relative: 32 %
Lymphs Abs: 1.3 10*3/uL (ref 0.7–4.0)
MCH: 27.7 pg (ref 26.0–34.0)
MCHC: 31.4 g/dL (ref 30.0–36.0)
MCV: 88.3 fL (ref 80.0–100.0)
Monocytes Absolute: 0.4 10*3/uL (ref 0.1–1.0)
Monocytes Relative: 9 %
Neutro Abs: 2.3 10*3/uL (ref 1.7–7.7)
Neutrophils Relative %: 58 %
Platelets: 228 10*3/uL (ref 150–400)
RBC: 4.01 MIL/uL (ref 3.87–5.11)
RDW: 15 % (ref 11.5–15.5)
WBC: 3.9 10*3/uL — ABNORMAL LOW (ref 4.0–10.5)
nRBC: 0 % (ref 0.0–0.2)

## 2019-07-09 LAB — COMPREHENSIVE METABOLIC PANEL
ALT: 11 U/L (ref 0–44)
AST: 25 U/L (ref 15–41)
Albumin: 4.6 g/dL (ref 3.5–5.0)
Alkaline Phosphatase: 40 U/L (ref 38–126)
Anion gap: 9 (ref 5–15)
BUN: 6 mg/dL (ref 6–20)
CO2: 25 mmol/L (ref 22–32)
Calcium: 9.1 mg/dL (ref 8.9–10.3)
Chloride: 102 mmol/L (ref 98–111)
Creatinine, Ser: 0.69 mg/dL (ref 0.44–1.00)
GFR calc Af Amer: >60 mL/min (ref >60)
GFR calc non Af Amer: >60 mL/min (ref >60)
Glucose, Bld: 105 mg/dL — ABNORMAL HIGH (ref 70–99)
Potassium: 2.6 mmol/L — CL (ref 3.5–5.1)
Sodium: 136 mmol/L (ref 135–145)
Total Bilirubin: 0.4 mg/dL (ref 0.3–1.2)
Total Protein: 8.2 g/dL — ABNORMAL HIGH (ref 6.5–8.1)

## 2019-07-09 LAB — PREGNANCY, URINE: Preg Test, Ur: NEGATIVE

## 2019-07-09 LAB — TROPONIN I (HIGH SENSITIVITY)
Troponin I (High Sensitivity): <2 ng/L (ref <18)
Troponin I (High Sensitivity): <2 ng/L (ref <18)

## 2019-07-09 LAB — RAPID URINE DRUG SCREEN, HOSP PERFORMED
Amphetamines: NOT DETECTED
Barbiturates: NOT DETECTED
Benzodiazepines: NOT DETECTED
Cocaine: POSITIVE — AB
Opiates: NOT DETECTED
Tetrahydrocannabinol: POSITIVE — AB

## 2019-07-09 LAB — MAGNESIUM: Magnesium: 1.5 mg/dL — ABNORMAL LOW (ref 1.7–2.4)

## 2019-07-09 LAB — LIPASE, BLOOD: Lipase: 18 U/L (ref 11–51)

## 2019-07-09 MED ORDER — POTASSIUM CHLORIDE 10 MEQ/100ML IV SOLN
10.0000 meq | Freq: Once | INTRAVENOUS | Status: AC
  Administered 2019-07-09: 10 meq via INTRAVENOUS
  Filled 2019-07-09: qty 100

## 2019-07-09 MED ORDER — POTASSIUM CHLORIDE 10 % PO SOLN: 10.0000 meq | Freq: Every day | ORAL | 0 refills | Status: DC

## 2019-07-09 MED ORDER — POTASSIUM CHLORIDE CRYS ER 20 MEQ PO TBCR
40.0000 meq | EXTENDED_RELEASE_TABLET | Freq: Once | ORAL | Status: DC
  Filled 2019-07-09: qty 2

## 2019-07-09 MED ORDER — MAGNESIUM SULFATE 50 % IJ SOLN
1.0000 g | Freq: Once | INTRAMUSCULAR | Status: AC
  Administered 2019-07-09: 1 g via INTRAVENOUS
  Filled 2019-07-09: qty 2

## 2019-07-09 MED ORDER — POTASSIUM CHLORIDE 20 MEQ/15ML (10%) PO SOLN
40.0000 meq | Freq: Every day | ORAL | Status: DC
  Administered 2019-07-09: 40 meq via ORAL
  Filled 2019-07-09: qty 30

## 2019-07-09 MED ORDER — KETOROLAC TROMETHAMINE 30 MG/ML IJ SOLN
30.0000 mg | Freq: Once | INTRAMUSCULAR | Status: AC
  Administered 2019-07-09: 30 mg via INTRAVENOUS
  Filled 2019-07-09: qty 1

## 2019-07-09 MED ORDER — IBUPROFEN 100 MG/5ML PO SUSP: 400.0000 mg | Freq: Four times a day (QID) | ORAL | 0 refills | Status: DC

## 2019-07-09 NOTE — ED Triage Notes (Signed)
Pain in her right chest and right arm. States it feels like a hammer is in her chest that started this am as an ache.  Unable to raise her arm. And her arm feels numb.

## 2019-07-09 NOTE — Discharge Instructions (Addendum)
Your work-up today was reassuring with no signs of heart attack or lung issues.  Your potassium was a little bit low.  Start taking potassium supplement once daily for the next 4 days beginning tomorrow.  You can take liquid Motrin every 6 hours as needed for pain.  Drink plenty of fluids and get rest.  Follow-up with primary care provider for reevaluation of symptoms.  Return to the emergency department if any concerning signs or symptoms develop such as severe worsening pain, severe shortness of breath, high fevers, persistent vomiting or loss of consciousness.

## 2019-07-09 NOTE — ED Provider Notes (Signed)
[facility] EMERGENCY DEPARTMENT Provider Note   CSN: [id number] Arrival date & time: 07/09/19  1905     History Chief Complaint  Patient presents with  . Chest Pain  . Arm Pain    Kelli Bowman is a 34 y.o. female with history of H. pylori infection, thyroid disease presents today for evaluation of acute onset, intermittent and progressively worsening chest pains.  Reports that symptoms began at around 10 AM when a family member told her her godson was not breathing.  She then found out that he passed away.  The pain is sharp, aching pressure, mostly along the right side of the chest but radiates to the left.  It worsens with movement of the right upper extremity; the arm itself does not hurt.  She denies any known injury to the chest.  She notes mild nausea but denies vomiting, syncope, diaphoresis, shortness of breath, cough, or fever.  She had a little bit of lightheadedness earlier but this has resolved.  The pain is intermittent, more constant now.  It worsened after using cocaine.  She has chronic upper abdominal pain which she states is at baseline.  She is a current smoker of approximately a pack of cigarettes daily, occasionally smokes marijuana.  She drank two shots of [place] [employer] tequila yesterday.  The history is provided by the patient.       Past Medical History:  Diagnosis Date  . H. pylori infection   . Thyroid disease     There are no problems to display for this patient.   Past Surgical History:  Procedure Laterality Date  . CESAREAN SECTION    . HERNIA REPAIR       OB History   No obstetric history on file.     No family history on file.  Social History   Tobacco Use  . Smoking status: Current Every Day Smoker    Packs/day: 0.50  . Smokeless tobacco: Never Used  Substance Use Topics  . Alcohol use: No  . Drug use: No    Home Medications Prior to Admission medications   Medication Sig Start Date End Date Taking? Authorizing  Provider  fluticasone (FLONASE) 50 MCG/ACT nasal spray Place 2 sprays into both nostrils daily. 05/14/19  Yes McVey, Madelaine Bhat, PA-C  levothyroxine (SYNTHROID, LEVOTHROID) 125 MCG tablet Take 1 tablet (125 mcg total) by mouth daily before breakfast. Patient taking differently: Take 125 mcg by mouth daily before breakfast.  06/01/18  Yes Gerhard Munch, MD  lidocaine (XYLOCAINE) 2 % solution Use as directed 15 mLs in the mouth or throat as needed for mouth pain. 03/30/17  Yes Maczis, Elmer Sow, PA-C  methocarbamol (ROBAXIN) 500 MG tablet Take 1 tablet (500 mg total) by mouth 2 (two) times daily. 04/28/19  Yes Cardama, Amadeo Garnet, MD  pantoprazole (PROTONIX) 20 MG tablet Take 2 tablets (40 mg total) by mouth daily for 7 days. 06/20/18 07/09/19 Yes Alvira Monday, MD  ibuprofen (ADVIL) 100 MG/5ML suspension Take 20 mLs (400 mg total) by mouth every 6 (six) hours. 07/09/19   Aiyah Scarpelli A, PA-C  naproxen (NAPROSYN) 500 MG tablet Take 1 tablet (500 mg total) by mouth 2 (two) times daily. 08/23/17   Petrucelli, Samantha R, PA-C  ondansetron (ZOFRAN ODT) 4 MG disintegrating tablet Take 1 tablet (4 mg total) by mouth every 8 (eight) hours as needed for nausea or vomiting. 04/06/19   Henderly, Britni A, PA-C  Potassium Chloride 10 % SOLN Take 10 mEq by mouth daily  for 4 days. 07/09/19 07/13/19  Michela Pitcher A, PA-C  potassium chloride SA (K-DUR,KLOR-CON) 20 MEQ tablet Take 1 tablet (20 mEq total) by mouth 2 (two) times daily. 05/18/18   Geoffery Lyons, MD  sucralfate (CARAFATE) 1 GM/10ML suspension Take 10 mLs (1 g total) by mouth 4 (four) times daily -  with meals and at bedtime. 03/01/18   Maxwell Caul, PA-C  traMADol (ULTRAM) 50 MG tablet Take 1 tablet (50 mg total) by mouth every 6 (six) hours as needed. 03/30/17   Maczis, Elmer Sow, PA-C    Allergies    Vicodin [hydrocodone-acetaminophen]  Review of Systems   Review of Systems  Constitutional: Negative for chills and fever.  Respiratory:  Negative for shortness of breath.   Cardiovascular: Positive for chest pain.  Gastrointestinal: Positive for abdominal pain and nausea. Negative for vomiting.  Neurological: Positive for light-headedness.  All other systems reviewed and are negative.   Physical Exam Updated Vital Signs BP 129/89   Pulse 75   Temp 98.4 F (36.9 C) (Oral)   Resp 14   Ht 5\' 10"  (1.778 m)   Wt 49.9 kg   LMP 06/23/2019   SpO2 100%   BMI 15.78 kg/m   Physical Exam Vitals and nursing note reviewed.  Constitutional:      General: She is not in acute distress.    Appearance: She is well-developed.  HENT:     Head: Normocephalic and atraumatic.  Eyes:     General:        Right eye: No discharge.        Left eye: No discharge.     Conjunctiva/sclera: Conjunctivae normal.  Neck:     Vascular: No JVD.     Trachea: No tracheal deviation.  Cardiovascular:     Rate and Rhythm: Normal rate and regular rhythm.     Comments: 2+ radial and DP/PT pulses bilaterally, Homans sign absent bilaterally, no lower extremity edema, no palpable cords, compartments are soft  Pulmonary:     Effort: Pulmonary effort is normal. No tachypnea or accessory muscle usage.  Chest:     Chest wall: Tenderness present.       Comments: No deformity, crepitus, ecchymosis or flail segment Abdominal:     General: Abdomen is flat. Bowel sounds are normal. There is no distension.     Palpations: Abdomen is soft.     Tenderness: There is generalized abdominal tenderness and tenderness in the right upper quadrant, epigastric area and left upper quadrant. There is no guarding or rebound.  Musculoskeletal:     Cervical back: Neck supple.     Right lower leg: No tenderness. No edema.     Left lower leg: No tenderness. No edema.  Skin:    General: Skin is warm and dry.     Findings: No erythema.  Neurological:     Mental Status: She is alert.  Psychiatric:        Behavior: Behavior normal.     ED Results / Procedures /  Treatments   Labs (all labs ordered are listed, but only abnormal results are displayed) Labs Reviewed  COMPREHENSIVE METABOLIC PANEL - Abnormal; Notable for the following components:      Result Value   Potassium 2.6 (*)    Glucose, Bld 105 (*)    Total Protein 8.2 (*)    All other components within normal limits  CBC WITH DIFFERENTIAL/PLATELET - Abnormal; Notable for the following components:   WBC 3.9 (*)  Hemoglobin 11.1 (*)    HCT 35.4 (*)    All other components within normal limits  URINALYSIS, ROUTINE W REFLEX MICROSCOPIC - Abnormal; Notable for the following components:   Color, Urine STRAW (*)    Specific Gravity, Urine <1.005 (*)    All other components within normal limits  RAPID URINE DRUG SCREEN, HOSP PERFORMED - Abnormal; Notable for the following components:   Cocaine POSITIVE (*)    Tetrahydrocannabinol POSITIVE (*)    All other components within normal limits  MAGNESIUM - Abnormal; Notable for the following components:   Magnesium 1.5 (*)    All other components within normal limits  LIPASE, BLOOD  PREGNANCY, URINE  TROPONIN I (HIGH SENSITIVITY)  TROPONIN I (HIGH SENSITIVITY)    EKG EKG Interpretation  Date/Time:  Tuesday July 09 2019 19:14:49 EST Ventricular Rate:  81 PR Interval:  198 QRS Duration: 82 QT Interval:  394 QTC Calculation: 457 R Axis:   89 Text Interpretation: Sinus rhythm with occasional Premature ventricular complexes Cannot rule out Anterior infarct , age undetermined No significant change since last tracing Confirmed by Blanchie Dessert [phone]) on 07/09/2019 7:46:21 PM Also confirmed by Blanchie Dessert [phone]), editor Hattie Perch [phone])  on 07/10/2019 12:14:44 PM   Radiology DG Chest 2 View  Result Date: 07/09/2019 CLINICAL DATA:  Chest pain EXAM: CHEST - 2 VIEW COMPARISON:  July 04, 2018 FINDINGS: The lungs are clear. Heart size and pulmonary vascularity are normal. No adenopathy. No pneumothorax or  pneumomediastinum. No bone lesions. IMPRESSION: No abnormality noted. Electronically Signed   By: Lowella Grip III M.D.   On: 07/09/2019 20:29    Procedures Procedures (including critical care time)  Medications Ordered in ED Medications  potassium chloride 10 mEq in 100 mL IVPB (0 mEq Intravenous Stopped 07/09/19 2221)  ketorolac (TORADOL) 30 MG/ML injection 30 mg (30 mg Intravenous Given 07/09/19 2119)  magnesium sulfate (IV Push/IM) injection 1 g (1 g Intravenous Given 07/09/19 2221)    ED Course  I have reviewed the triage vital signs and the nursing notes.  Pertinent labs & imaging results that were available during my care of the patient were reviewed by me and considered in my medical decision making (see chart for details).    MDM Rules/Calculators/A&P                      Patient presenting for evaluation of right-sided chest pain which worsened with movement of the right upper extremity that began after finding out that her grandson was not breathing.  She reports that he has since passed away.  The pain worsened after using cocaine.  In the ED the patient is afebrile and vital signs are stable.  She is nontoxic in appearance.  The pain is reproducible on palpation suggesting possible musculoskeletal etiology.  However in the setting of significant emotional stress, differential considerations include Takotsubo cardiomyopathy.  Her EKG shows no significant changes from last tracing though there is some artifact.  Lab work reviewed by me shows no leukocytosis, mild anemia, hypokalemia and hypomagnesemia.  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  She refused p.o. potassium supplementation with pills but she is able to tolerate oral liquid solution.  She was also given a round of IV potassium and magnesium.  Her UDS is positive for cocaine and THC so we will avoid giving her any beta-blockers if indicated.  Serial troponins are negative which is reassuring.  Her  chest x-ray shows no  acute cardiopulmonary abnormalities with no evidence of pneumothorax, pneumomediastinum or pneumonia.  Doubt dissection, PE, cardiac tamponade, or esophageal rupture.  She has chronic generalized abdominal pain which she reports is at baseline at this time.  I doubt acute surgical abdominal pathology.  She was given a dose of Toradol in the ED with improvement in her pain.  On reevaluation she is resting quite comfortably reports feeling much better and feeling comfortable with discharge home.  I recommended that she stop using illicit drugs.  We will discharge her home with a few days of liquid potassium solution.  We discussed that her pain could be musculoskeletal in etiology and discussed the utility of anti-inflammatories and Tylenol.  Discussed strict ED return precautions.  She will follow-up with PCP for reevaluation. Patient verbalized understanding of and agreement with plan and is safe for discharge home at this time.  Final Clinical Impression(s) / ED Diagnoses Final diagnoses:  Atypical chest pain  Chest wall pain  Hypokalemia    Rx / DC Orders ED Discharge Orders         Ordered    Potassium Chloride 10 % SOLN  Daily,   Status:  Discontinued     07/09/19 2315    Potassium Chloride 10 % SOLN  Daily     07/09/19 2316    ibuprofen (ADVIL) 100 MG/5ML suspension  Every 6 hours     07/09/19 2316           Jeanie Sewer, PA-C 07/10/19 1449    Gwyneth Sprout, MD 07/13/19 [phone]

## 2019-07-09 NOTE — ED Notes (Signed)
Pt transported to XR.  

## 2019-09-18 ENCOUNTER — Other Ambulatory Visit: Payer: Self-pay

## 2019-09-18 ENCOUNTER — Encounter (HOSPITAL_BASED_OUTPATIENT_CLINIC_OR_DEPARTMENT_OTHER): Payer: Self-pay | Admitting: Emergency Medicine

## 2019-09-18 ENCOUNTER — Emergency Department (HOSPITAL_BASED_OUTPATIENT_CLINIC_OR_DEPARTMENT_OTHER)
Admission: EM | Admit: 2019-09-18 | Discharge: 2019-09-18 | Disposition: A | Discharge status: Home / Self Care | Payer: Medicaid Other | Attending: Emergency Medicine | Admitting: Emergency Medicine

## 2019-09-18 DIAGNOSIS — M62838 Other muscle spasm: Secondary | ICD-10-CM | POA: Insufficient documentation

## 2019-09-18 DIAGNOSIS — R079 Chest pain, unspecified: Secondary | ICD-10-CM | POA: Diagnosis present

## 2019-09-18 DIAGNOSIS — F17200 Nicotine dependence, unspecified, uncomplicated: Secondary | ICD-10-CM | POA: Diagnosis not present

## 2019-09-18 DIAGNOSIS — Z79899 Other long term (current) drug therapy: Secondary | ICD-10-CM | POA: Insufficient documentation

## 2019-09-18 HISTORY — DX: Low back pain, unspecified: M54.50

## 2019-09-18 HISTORY — DX: Other chest pain: R07.89

## 2019-09-18 HISTORY — DX: Cannabis abuse, uncomplicated: F12.10

## 2019-09-18 HISTORY — DX: Other chronic pain: G89.29

## 2019-09-18 MED ORDER — KETOROLAC TROMETHAMINE 60 MG/2ML IM SOLN
30.0000 mg | Freq: Once | INTRAMUSCULAR | Status: DC
  Filled 2019-09-18: qty 2

## 2019-09-18 NOTE — ED Provider Notes (Signed)
[facility] EMERGENCY DEPARTMENT Provider Note  CSN: [id number] Arrival date & time: 09/18/19 0120  Chief Complaint(s) Chest Pain  HPI Kelli Bowman is a 34 y.o. female with a past medical history listed below including hypothyroidism,, hypokalemia and hypomagnesemia with frequent muscle spasms who presents to the emergency department with typical muscle spastic chest pain located in the bilateral upper chest/axillary region.  Patient reports that the spasms occur frequently and typically resolve with applying heat.  Tonight the spasm was intense and did not resolve with applying heat.  This began approximately 3 hours prior to arrival.  Spasm has improved in route and is currently mild.  She denies any associated chest pain or shortness of breath.  No trauma.  No substernal chest pain.  Pain is nonradiating and nonexertional.  Worse with movement and palpation.  No other physical complaints.  HPI  Past Medical History Past Medical History:  Diagnosis Date  . Atypical chest pain   . Chronic abdominal pain   . Chronic lower back pain   . Chronic neck pain   . H. pylori infection   . Marijuana abuse, continuous   . Thyroid disease    There are no problems to display for this patient.  Home Medication(s) Prior to Admission medications   Medication Sig Start Date End Date Taking? Authorizing Provider  fluticasone (FLONASE) 50 MCG/ACT nasal spray Place 2 sprays into both nostrils daily. 05/14/19   McVey, Madelaine Bhat, PA-C  ibuprofen (ADVIL) 100 MG/5ML suspension Take 20 mLs (400 mg total) by mouth every 6 (six) hours. 07/09/19   Michela Pitcher A, PA-C  levothyroxine (SYNTHROID, LEVOTHROID) 125 MCG tablet Take 1 tablet (125 mcg total) by mouth daily before breakfast. Patient taking differently: Take 125 mcg by mouth daily before breakfast.  06/01/18   Gerhard Munch, MD  loperamide (IMODIUM) 2 MG capsule Take 2 mg by mouth daily as needed. 07/03/19   [provider]    methocarbamol (ROBAXIN) 500 MG tablet Take 1 tablet (500 mg total) by mouth 2 (two) times daily. 04/28/19   Nira Conn, MD  mirtazapine (REMERON) 15 MG tablet Take 7.5 mg by mouth at bedtime. 08/22/19   [provider]  ondansetron (ZOFRAN ODT) 4 MG disintegrating tablet Take 1 tablet (4 mg total) by mouth every 8 (eight) hours as needed for nausea or vomiting. 04/06/19   Henderly, Britni A, PA-C  pantoprazole (PROTONIX) 20 MG tablet Take 2 tablets (40 mg total) by mouth daily for 7 days. 06/20/18 07/09/19  Alvira Monday, MD  tiZANidine (ZANAFLEX) 4 MG tablet Take 4 mg by mouth 2 (two) times daily. 08/15/19   [provider]                                                                                                                                    Past Surgical History Past Surgical History:  Procedure Laterality Date  . CESAREAN  SECTION    . HERNIA REPAIR    . THYROIDECTOMY, PARTIAL     Family History No family history on file.  Social History Social History   Tobacco Use  . Smoking status: Current Every Day Smoker    Packs/day: 0.50  . Smokeless tobacco: Never Used  Substance Use Topics  . Alcohol use: No  . Drug use: Yes    Types: Marijuana, Cocaine   Allergies Vicodin [hydrocodone-acetaminophen]  Review of Systems Review of Systems All other systems are reviewed and are negative for acute change except as noted in the HPI  Physical Exam Vital Signs  I have reviewed the triage vital signs BP 117/89 (BP Location: Left Arm)   Pulse 70   Temp 98 F (36.7 C) (Oral)   Resp 16   Ht 5\' 6"  (1.676 m)   Wt 49 kg   SpO2 99%   BMI 17.44 kg/m   Physical Exam Vitals reviewed.  Constitutional:      General: She is not in acute distress.    Appearance: She is well-developed. She is not diaphoretic.  HENT:     Head: Normocephalic and atraumatic.     Nose: Nose normal.  Eyes:     General: No scleral icterus.       Right eye: No discharge.         Left eye: No discharge.     Conjunctiva/sclera: Conjunctivae normal.     Pupils: Pupils are equal, round, and reactive to light.  Cardiovascular:     Rate and Rhythm: Normal rate and regular rhythm.     Heart sounds: No murmur. No friction rub. No gallop.   Pulmonary:     Effort: Pulmonary effort is normal. No respiratory distress.     Breath sounds: Normal breath sounds. No stridor. No rales.  Chest:     Chest wall: Tenderness present.    Abdominal:     General: There is no distension.     Palpations: Abdomen is soft.     Tenderness: There is no abdominal tenderness.  Musculoskeletal:        General: No tenderness.     Cervical back: Normal range of motion and neck supple.  Skin:    General: Skin is warm and dry.     Findings: No erythema or rash.  Neurological:     Mental Status: She is alert and oriented to person, place, and time.     ED Results and Treatments Labs (all labs ordered are listed, but only abnormal results are displayed) Labs Reviewed - No data to display                                                                                                                       EKG  EKG Interpretation  Date/Time:  Wednesday September 18 2019 01:34:36 EDT Ventricular Rate:  64 PR Interval:    QRS Duration: 88 QT Interval:  464 QTC Calculation: 479 R Axis:   92 Text Interpretation:  Sinus rhythm Atrial premature complex Prolonged PR interval Baseline wander in lead(s) V3 V6 Otherwise no significant change Confirmed by Drema Pry [phone]) on 09/18/2019 2:02:30 AM      Radiology No results found.  Pertinent labs & imaging results that were available during my care of the patient were reviewed by me and considered in my medical decision making (see chart for details).  Medications Ordered in ED Medications  ketorolac (TORADOL) injection 30 mg (30 mg Intramuscular Not Given 09/18/19 0240)                                                                                                                                     Procedures Procedures  (including critical care time)  Medical Decision Making / ED Course I have reviewed the nursing notes for this encounter and the patient's prior records (if available in EHR or on provided paperwork).   Mahina Salatino was evaluated in Emergency Department on 09/18/2019 for the symptoms described in the history of present illness. She was evaluated in the context of the global COVID-19 pandemic, which necessitated consideration that the patient might be at risk for infection with the SARS-CoV-2 virus that causes COVID-19. Institutional protocols and algorithms that pertain to the evaluation of patients at risk for COVID-19 are in a state of rapid change based on information released by regulatory bodies including the CDC and federal and state organizations. These policies and algorithms were followed during the patient's care in the ED.  Bilateral upper/axillary chest pain attributed to typical muscle spasms.  EKG without acute ischemic changes or evidence of pericarditis.  Highly inconsistent with ACS.  Do not feel that patient warrants additional cardiac evaluation.  Low suspicion for pulmonary embolism.  Lungs clear to auscultation bilaterally, no respiratory distress or shortness of breath.  Doubt pneumothorax.  Presentation not classic for aortic dissection or esophageal perforation.  Consistent with muscular pain.  Offered IM Toradol which patient declined.  Provided with heat pack.      Final Clinical Impression(s) / ED Diagnoses Final diagnoses:  Muscle spasm   The patient appears reasonably screened and/or stabilized for discharge and I doubt any other medical condition or other [facility] requiring further screening, evaluation, or treatment in the ED at this time prior to discharge. Safe for discharge with strict return precautions.  Disposition: Discharge  Condition: Good  I have discussed the  results, Dx and Tx plan with the patient/family who expressed understanding and agree(s) with the plan. Discharge instructions discussed at length. The patient/family was given strict return precautions who verbalized understanding of the instructions. No further questions at time of discharge.    ED Discharge Orders    None       Follow Up: Inc, [facility] [address] [place] [address] [phone]         This chart was dictated using voice recognition software.  Despite best efforts to proofread,  errors can occur which can change the documentation meaning.   Nira Conn, MD 09/18/19 [phone]

## 2019-09-18 NOTE — Discharge Instructions (Addendum)
You may use over-the-counter Motrin (Ibuprofen), Acetaminophen (Tylenol), topical muscle creams such as SalonPas, Icy Hot, Bengay, etc. Please stretch, apply heat, and have massage therapy for additional assistance. ° °

## 2019-09-18 NOTE — ED Triage Notes (Signed)
Pt states "I'm having muscle spasms in my chest again."

## 2019-09-18 NOTE — ED Notes (Signed)
ED Provider at bedside. 

## 2020-04-26 ENCOUNTER — Emergency Department (HOSPITAL_BASED_OUTPATIENT_CLINIC_OR_DEPARTMENT_OTHER): Payer: Medicaid Other

## 2020-04-26 ENCOUNTER — Emergency Department (HOSPITAL_BASED_OUTPATIENT_CLINIC_OR_DEPARTMENT_OTHER)
Admission: EM | Admit: 2020-04-26 | Discharge: 2020-04-26 | Disposition: A | Discharge status: Home / Self Care | Payer: Medicaid Other | Attending: Emergency Medicine | Admitting: Emergency Medicine

## 2020-04-26 ENCOUNTER — Other Ambulatory Visit: Payer: Self-pay

## 2020-04-26 ENCOUNTER — Encounter (HOSPITAL_BASED_OUTPATIENT_CLINIC_OR_DEPARTMENT_OTHER): Payer: Self-pay | Admitting: Emergency Medicine

## 2020-04-26 DIAGNOSIS — M79651 Pain in right thigh: Secondary | ICD-10-CM | POA: Insufficient documentation

## 2020-04-26 DIAGNOSIS — R55 Syncope and collapse: Secondary | ICD-10-CM | POA: Insufficient documentation

## 2020-04-26 DIAGNOSIS — Z79899 Other long term (current) drug therapy: Secondary | ICD-10-CM | POA: Insufficient documentation

## 2020-04-26 DIAGNOSIS — F17210 Nicotine dependence, cigarettes, uncomplicated: Secondary | ICD-10-CM | POA: Diagnosis not present

## 2020-04-26 DIAGNOSIS — R1031 Right lower quadrant pain: Secondary | ICD-10-CM | POA: Diagnosis present

## 2020-04-26 HISTORY — DX: Sciatica, unspecified side: M54.30

## 2020-04-26 LAB — COMPREHENSIVE METABOLIC PANEL
ALT: 12 U/L (ref 0–44)
AST: 27 U/L (ref 15–41)
Albumin: 4.2 g/dL (ref 3.5–5.0)
Alkaline Phosphatase: 35 U/L — ABNORMAL LOW (ref 38–126)
Anion gap: 12 (ref 5–15)
BUN: 8 mg/dL (ref 6–20)
CO2: 24 mmol/L (ref 22–32)
Calcium: 8.9 mg/dL (ref 8.9–10.3)
Chloride: 99 mmol/L (ref 98–111)
Creatinine, Ser: 0.62 mg/dL (ref 0.44–1.00)
GFR, Estimated: >60 mL/min (ref >60)
Glucose, Bld: 89 mg/dL (ref 70–99)
Potassium: 3.2 mmol/L — ABNORMAL LOW (ref 3.5–5.1)
Sodium: 135 mmol/L (ref 135–145)
Total Bilirubin: 0.2 mg/dL — ABNORMAL LOW (ref 0.3–1.2)
Total Protein: 8.1 g/dL (ref 6.5–8.1)

## 2020-04-26 LAB — CBC WITH DIFFERENTIAL/PLATELET
Abs Immature Granulocytes: 0.03 10*3/uL (ref 0.00–0.07)
Basophils Absolute: 0 10*3/uL (ref 0.0–0.1)
Basophils Relative: 1 %
Eosinophils Absolute: 0 10*3/uL (ref 0.0–0.5)
Eosinophils Relative: 1 %
HCT: 35 % — ABNORMAL LOW (ref 36.0–46.0)
Hemoglobin: 10.7 g/dL — ABNORMAL LOW (ref 12.0–15.0)
Immature Granulocytes: 1 %
Lymphocytes Relative: 26 %
Lymphs Abs: 1.1 10*3/uL (ref 0.7–4.0)
MCH: 26.4 pg (ref 26.0–34.0)
MCHC: 30.6 g/dL (ref 30.0–36.0)
MCV: 86.4 fL (ref 80.0–100.0)
Monocytes Absolute: 0.4 10*3/uL (ref 0.1–1.0)
Monocytes Relative: 10 %
Neutro Abs: 2.7 10*3/uL (ref 1.7–7.7)
Neutrophils Relative %: 61 %
Platelets: 187 10*3/uL (ref 150–400)
RBC: 4.05 MIL/uL (ref 3.87–5.11)
RDW: 15.6 % — ABNORMAL HIGH (ref 11.5–15.5)
WBC: 4.3 10*3/uL (ref 4.0–10.5)
nRBC: 0 % (ref 0.0–0.2)

## 2020-04-26 LAB — RAPID URINE DRUG SCREEN, HOSP PERFORMED
Amphetamines: NOT DETECTED
Barbiturates: NOT DETECTED
Benzodiazepines: NOT DETECTED
Cocaine: POSITIVE — AB
Opiates: NOT DETECTED
Tetrahydrocannabinol: POSITIVE — AB

## 2020-04-26 LAB — URINALYSIS, ROUTINE W REFLEX MICROSCOPIC
Bilirubin Urine: NEGATIVE
Glucose, UA: NEGATIVE mg/dL
Hgb urine dipstick: NEGATIVE
Ketones, ur: NEGATIVE mg/dL
Leukocytes,Ua: NEGATIVE
Nitrite: NEGATIVE
Protein, ur: NEGATIVE mg/dL
Specific Gravity, Urine: <1.005 — ABNORMAL LOW (ref 1.005–1.030)
pH: 6 (ref 5.0–8.0)

## 2020-04-26 LAB — LIPASE, BLOOD: Lipase: 25 U/L (ref 11–51)

## 2020-04-26 LAB — PREGNANCY, URINE: Preg Test, Ur: NEGATIVE

## 2020-04-26 MED ORDER — ONDANSETRON HCL 4 MG/2ML IJ SOLN
4.0000 mg | Freq: Once | INTRAMUSCULAR | Status: AC
  Administered 2020-04-26: 4 mg via INTRAVENOUS
  Filled 2020-04-26: qty 2

## 2020-04-26 MED ORDER — FENTANYL CITRATE (PF) 100 MCG/2ML IJ SOLN
100.0000 ug | Freq: Once | INTRAMUSCULAR | Status: AC
  Administered 2020-04-26: 100 ug via INTRAVENOUS
  Filled 2020-04-26: qty 2

## 2020-04-26 MED ORDER — IOHEXOL 300 MG/ML  SOLN
100.0000 mL | Freq: Once | INTRAMUSCULAR | Status: AC | PRN
  Administered 2020-04-26: 100 mL via INTRAVENOUS

## 2020-04-26 NOTE — ED Provider Notes (Signed)
Patient signed out to me by Dr Read Drivers.  Briefly this is a 34 year old female presenting to the ED with syncope and right lower quadrant pain, reporting also some paresthesias in her right thigh.  Here labs are largely unremarkable, with UDS showing +cocaine and THC.  No signs of sepsis.  Pregnancy negative.  CT abdomen/pelvis pending.  0830 - no acute emergent findings on CT.  Patient reports her abdominal pain is completely gone, and clarifies to me that "I usually get abdominal pains when I eat something bad, I've got a sensitive stomach," and says this pain wasn't unusual, just more severe.  With this in mind, I have a lower suspicion for ovarian torsion or acute appendicitis.  Given that she is asymptomatic now, she is okay for discharge.  We discussed her UDS results - she reports she did not use any drugs yesterday evening, but only uses recreational drugs occasionally when she is being intimate with her partner.  Plan for discharge   Terald Sleeper, MD 04/26/20 [phone]

## 2020-04-26 NOTE — ED Notes (Signed)
She is drowsy and comfortable. She tells me she feels "a lot better".

## 2020-04-26 NOTE — ED Triage Notes (Signed)
Pt states was told she passed out about 45 mins ago, has felt like she was going to pass out since. Has pain in lower right abdomen and numbness in right leg since.

## 2020-04-26 NOTE — Discharge Instructions (Addendum)
Please drink plenty of fluid at home, and take it easy for the next 2 days.  Follow-up with your primary care doctor this week.

## 2020-04-26 NOTE — ED Provider Notes (Signed)
[facility]-EMERGENCY DEPT Provider Note: [name], MD, FACEP  CSN: [id number] MRN: [id number] ARRIVAL: 04/26/20 at 0406 ROOM: [facility]06/[facility]06   CHIEF COMPLAINT  Syncope   HISTORY OF PRESENT ILLNESS  04/26/20 4:32 AM Kelli Bowman is a 34 y.o. female with a history of chronic abdominal pain, chronic neck pain and sciatica.  She was standing talking to other people about an hour prior to arrival when she felt lightheaded.  She suddenly passed out and her friends caught her to prevent her from falling.  When she came to she was having, and continues to have, a sharp right lower quadrant abdominal pain.  She rates it as a 10 out of 10, worse with movement or palpation.  She is also having paresthesias of her right anterior thigh and pain when she moves her right thigh.  She relates the pain and paresthesias in her thigh to the pain in her abdomen.  She is not nauseated or vomiting.   Past Medical History:  Diagnosis Date   Atypical chest pain    Chronic abdominal pain    Chronic lower back pain    Chronic neck pain    H. pylori infection    Marijuana abuse, continuous    Sciatica    Thyroid disease     Past Surgical History:  Procedure Laterality Date   CESAREAN SECTION     HERNIA REPAIR     THYROIDECTOMY, PARTIAL      History reviewed. No pertinent family history.  Social History   Tobacco Use   Smoking status: Current Every Day Smoker    Packs/day: 0.50   Smokeless tobacco: Never Used  Vaping Use   Vaping Use: Never used  Substance Use Topics   Alcohol use: No   Drug use: Yes    Types: Marijuana    Prior to Admission medications   Medication Sig Start Date End Date Taking? Authorizing Provider  ibuprofen (ADVIL) 100 MG/5ML suspension Take 20 mLs (400 mg total) by mouth every 6 (six) hours. 07/09/19   Michela Pitcher A, PA-C  levothyroxine (SYNTHROID, LEVOTHROID) 125 MCG tablet Take 1 tablet (125 mcg total) by mouth daily before breakfast. Patient taking  differently: Take 125 mcg by mouth daily before breakfast.  06/01/18   Gerhard Munch, MD  methocarbamol (ROBAXIN) 500 MG tablet Take 1 tablet (500 mg total) by mouth 2 (two) times daily. 04/28/19   Nira Conn, MD  fluticasone (FLONASE) 50 MCG/ACT nasal spray Place 2 sprays into both nostrils daily. 05/14/19 04/26/20  McVey, Madelaine Bhat, PA-C  mirtazapine (REMERON) 15 MG tablet Take 7.5 mg by mouth at bedtime. 08/22/19 04/26/20  [provider]  pantoprazole (PROTONIX) 20 MG tablet Take 2 tablets (40 mg total) by mouth daily for 7 days. 06/20/18 04/26/20  Alvira Monday, MD    Allergies Hydrocodone-acetaminophen and Vicodin [hydrocodone-acetaminophen]   REVIEW OF SYSTEMS  Negative except as noted here or in the History of Present Illness.   PHYSICAL EXAMINATION  Initial Vital Signs Blood pressure 115/76, pulse 74, temperature 97.7 F (36.5 C), temperature source Oral, resp. rate 20, height 5\' 6"  (1.676 m), weight 49.9 kg, last menstrual period 04/19/2020, SpO2 100 %.  Examination General: Well-developed, cachectic female in no acute distress; appears older than age of record HENT: normocephalic; atraumatic Eyes: pupils equal, round and reactive to light; extraocular muscles intact Neck: supple Heart: regular rate and rhythm Lungs: clear to auscultation bilaterally Abdomen: soft; nondistended; right lower quadrant tenderness; no masses or hepatosplenomegaly; bowel sounds present  Extremities: No deformity; full range of motion; pulses normal Neurologic: Awake, alert and oriented; motor function intact in all extremities and symmetric; no facial droop; decreased sensation of right anterior thigh Skin: Warm and dry Psychiatric: Flat affect   RESULTS  Summary of this visit's results, reviewed and interpreted by myself:   EKG Interpretation  Date/Time:  Sunday April 26 2020 04:50:33 EST Ventricular Rate:  83 PR Interval:    QRS Duration: 105 QT  Interval:  412 QTC Calculation: 485 R Axis:   92 Text Interpretation: Sinus rhythm Prolonged PR interval Borderline right axis deviation Borderline prolonged QT interval Rate is faster Confirmed by Paula LibraMolpus, Emmalyn Hinson ([id number]) on 04/26/2020 4:53:27 AM      Laboratory Studies: Results for orders placed or performed during the hospital encounter of 04/26/20 (from the past 24 hour(s))  CBC with Differential/Platelet     Status: Abnormal   Collection Time: 04/26/20  4:55 AM  Result Value Ref Range   WBC 4.3 4.0 - 10.5 K/uL   RBC 4.05 3.87 - 5.11 MIL/uL   Hemoglobin 10.7 (L) 12.0 - 15.0 g/dL   HCT 60.435.0 (L) 36 - 46 %   MCV 86.4 80.0 - 100.0 fL   MCH 26.4 26.0 - 34.0 pg   MCHC 30.6 30.0 - 36.0 g/dL   RDW 54.015.6 (H) 98.111.5 - 19.115.5 %   Platelets 187 150 - 400 K/uL   nRBC 0.0 0.0 - 0.2 %   Neutrophils Relative % 61 %   Neutro Abs 2.7 1.7 - 7.7 K/uL   Lymphocytes Relative 26 %   Lymphs Abs 1.1 0.7 - 4.0 K/uL   Monocytes Relative 10 %   Monocytes Absolute 0.4 0.1 - 1.0 K/uL   Eosinophils Relative 1 %   Eosinophils Absolute 0.0 0.0 - 0.5 K/uL   Basophils Relative 1 %   Basophils Absolute 0.0 0.0 - 0.1 K/uL   Immature Granulocytes 1 %   Abs Immature Granulocytes 0.03 0.00 - 0.07 K/uL  Comprehensive metabolic panel     Status: Abnormal   Collection Time: 04/26/20  4:55 AM  Result Value Ref Range   Sodium 135 135 - 145 mmol/L   Potassium 3.2 (L) 3.5 - 5.1 mmol/L   Chloride 99 98 - 111 mmol/L   CO2 24 22 - 32 mmol/L   Glucose, Bld 89 70 - 99 mg/dL   BUN 8 6 - 20 mg/dL   Creatinine, Ser 4.780.62 0.44 - 1.00 mg/dL   Calcium 8.9 8.9 - 29.510.3 mg/dL   Total Protein 8.1 6.5 - 8.1 g/dL   Albumin 4.2 3.5 - 5.0 g/dL   AST 27 15 - 41 U/L   ALT 12 0 - 44 U/L   Alkaline Phosphatase 35 (L) 38 - 126 U/L   Total Bilirubin 0.2 (L) 0.3 - 1.2 mg/dL   GFR, Estimated >62>60 >13>60 mL/min   Anion gap 12 5 - 15  Lipase, blood     Status: None   Collection Time: 04/26/20  4:55 AM  Result Value Ref Range   Lipase 25 11 - 51  U/L  Urinalysis, Routine w reflex microscopic     Status: Abnormal   Collection Time: 04/26/20  4:55 AM  Result Value Ref Range   Color, Urine YELLOW YELLOW   APPearance CLEAR CLEAR   Specific Gravity, Urine <1.005 (L) 1.005 - 1.030   pH 6.0 5.0 - 8.0   Glucose, UA NEGATIVE NEGATIVE mg/dL   Hgb urine dipstick NEGATIVE NEGATIVE   Bilirubin Urine NEGATIVE  NEGATIVE   Ketones, ur NEGATIVE NEGATIVE mg/dL   Protein, ur NEGATIVE NEGATIVE mg/dL   Nitrite NEGATIVE NEGATIVE   Leukocytes,Ua NEGATIVE NEGATIVE  Pregnancy, urine     Status: None   Collection Time: 04/26/20  4:55 AM  Result Value Ref Range   Preg Test, Ur NEGATIVE NEGATIVE  Rapid urine drug screen (hospital performed)     Status: Abnormal   Collection Time: 04/26/20  4:55 AM  Result Value Ref Range   Opiates NONE DETECTED NONE DETECTED   Cocaine POSITIVE (A) NONE DETECTED   Benzodiazepines NONE DETECTED NONE DETECTED   Amphetamines NONE DETECTED NONE DETECTED   Tetrahydrocannabinol POSITIVE (A) NONE DETECTED   Barbiturates NONE DETECTED NONE DETECTED   Imaging Studies: CT ABDOMEN PELVIS W CONTRAST  Result Date: 04/26/2020 CLINICAL DATA:  Right lower abdominal pain EXAM: CT ABDOMEN AND PELVIS WITH CONTRAST TECHNIQUE: Multidetector CT imaging of the abdomen and pelvis was performed using the standard protocol following bolus administration of intravenous contrast. Oral contrast also administered. CONTRAST:  OMNIPAQUE IOHEXOL 300 MG/ML  SOLN COMPARISON:  June 20, 2018 FINDINGS: Lower chest: Lung bases are clear. Hepatobiliary: There is a degree of hepatic steatosis. No focal liver lesions are appreciable. Gallbladder wall is not appreciably thickened. There is no biliary duct dilatation. Pancreas: No pancreatic mass or inflammatory focus. Spleen: No splenic lesions are evident. Adrenals/Urinary Tract: Adrenals bilaterally appear normal. There is a 4 mm cyst in the anterior upper left kidney. There is an extrarenal pelvis on  each side, an anatomic variant. There is no evident hydronephrosis on either side. There is a 2 mm calculus in the lower pole of the left kidney, stable. There is no evident ureteral calculus on either side. Urinary bladder is midline with wall thickness within normal limits. Stomach/Bowel: There is no appreciable bowel wall or mesenteric thickening. There is no demonstrable bowel obstruction. The terminal ileum appears normal. There is no evident free air or portal venous air. Stomach is mildly distended fluid and food material. Vascular/Lymphatic: No evident abdominal aortic aneurysm. No appreciable arterial vascular lesions. Major venous structures appear patent. No evident adenopathy in the abdomen or pelvis. Reproductive: Uterus is anteverted. There is an area enhancement in the uterus anteriorly measuring 1.8 x 1.4 cm, a probable small leiomyoma. No adnexal lesions are evident. Other: No periappendiceal region inflammation. Appendix is less than optimally seen and appears diminutive. No abscess or ascites evident in the abdomen or pelvis. Evidence of previous ventral hernia repair. Musculoskeletal: No blastic or lytic bone lesions. No intramuscular lesions. IMPRESSION: 1. No bowel wall thickening or bowel obstruction. No abscess in the abdomen or pelvis. No appendiceal region inflammation. 2. Probable leiomyoma in the anterior uterus measuring 1.8 x 1.4 cm. 3.  Hepatic steatosis. 4. Stable 2 mm calculus lower pole left kidney. No hydronephrosis or ureteral calculus on either side. Urinary bladder wall thickness normal. 5.  Postoperative midline abdominal wall hernia repair. Electronically Signed   By: Bretta Bang III M.D.   On: 04/26/2020 08:06    ED COURSE and MDM  Nursing notes, initial and subsequent vitals signs, including pulse oximetry, reviewed and interpreted by myself.  Vitals:   04/26/20 0738 04/26/20 0745 04/26/20 0800 04/26/20 0815  BP: 127/83  123/87   Pulse: 72 65 70 71  Resp: 14  12 12 12   Temp:      TempSrc:      SpO2: 100% 100% 100% 100%  Weight:      Height:  Medications  ondansetron (ZOFRAN) injection 4 mg (4 mg Intravenous Given 04/26/20 0524)  fentaNYL (SUBLIMAZE) injection 100 mcg (100 mcg Intravenous Given 04/26/20 0527)  iohexol (OMNIPAQUE) 300 MG/ML solution 100 mL (100 mLs Intravenous Contrast Given 04/26/20 0651)   6:49 AM Awaiting CT of the abdomen and pelvis to evaluate right lower quadrant pain.  Signed out to morning EDP, Dr. Renaye Rakers.    PROCEDURES  Procedures   ED DIAGNOSES     ICD-10-CM   1. Right lower quadrant abdominal pain  R10.31   2. Near syncope  R55        Paula Libra, MD 04/26/20 2228

## 2020-10-03 ENCOUNTER — Other Ambulatory Visit: Payer: Self-pay

## 2020-10-03 ENCOUNTER — Emergency Department (HOSPITAL_BASED_OUTPATIENT_CLINIC_OR_DEPARTMENT_OTHER)
Admission: EM | Admit: 2020-10-03 | Discharge: 2020-10-03 | Disposition: A | Discharge status: Home / Self Care | Payer: Medicaid Other | Attending: Emergency Medicine | Admitting: Emergency Medicine

## 2020-10-03 DIAGNOSIS — K0889 Other specified disorders of teeth and supporting structures: Secondary | ICD-10-CM | POA: Diagnosis present

## 2020-10-03 DIAGNOSIS — R590 Localized enlarged lymph nodes: Secondary | ICD-10-CM | POA: Diagnosis not present

## 2020-10-03 DIAGNOSIS — K029 Dental caries, unspecified: Secondary | ICD-10-CM

## 2020-10-03 DIAGNOSIS — F17200 Nicotine dependence, unspecified, uncomplicated: Secondary | ICD-10-CM | POA: Insufficient documentation

## 2020-10-03 MED ORDER — AMOXICILLIN 500 MG PO CAPS: 500.0000 mg | ORAL_CAPSULE | Freq: Three times a day (TID) | ORAL | 0 refills | Status: DC

## 2020-10-03 MED ORDER — NAPROXEN 250 MG PO TABS
500.0000 mg | ORAL_TABLET | Freq: Once | ORAL | Status: AC
  Administered 2020-10-03: 500 mg via ORAL
  Filled 2020-10-03: qty 2

## 2020-10-03 MED ORDER — IBUPROFEN 400 MG PO TABS: 400.0000 mg | ORAL_TABLET | Freq: Four times a day (QID) | ORAL | 0 refills | Status: AC | PRN

## 2020-10-03 NOTE — ED Triage Notes (Signed)
Tooth pain.

## 2020-10-03 NOTE — Discharge Instructions (Signed)
Gargle with salt water to clean out the mouth.  Take the amoxicillin 3 times a day for the next 1 week.  Use the ibuprofen every 6 hours for pain.  Also heating pad may help.  If you are unable to get an appointment with Dentist [facility] there were other dental resources provided for Medicaid.

## 2020-10-03 NOTE — ED Provider Notes (Signed)
[facility] EMERGENCY DEPARTMENT Provider Note   CSN: [id number] Arrival date & time: 10/03/20  0740     History Chief Complaint  Patient presents with  . Dental Pain    Kelli Bowman is a 35 y.o. female.  The history is provided by the patient.  Dental Pain Location:  Upper Upper teeth location:  4/RU 2nd bicuspid Quality:  Aching, localized, pulsating, radiating and shooting Severity:  Severe Onset quality:  Gradual Duration:  3 days Timing:  Constant Progression:  Worsening Chronicity:  Recurrent Context: dental caries and poor dentition   Relieved by: Had a few leftover amoxicillin and took 1 a day for the last 3 days which seem to help with the pain some. Worsened by:  Cold food/drink, hot food/drink, touching and jaw movement Ineffective treatments:  None tried Associated symptoms: facial pain and neck pain   Associated symptoms: no difficulty swallowing, no drooling, no facial swelling, no fever, no gum swelling, no neck swelling, no oral bleeding, no oral lesions and no trismus   Risk factors comment:  History of chronic abdominal, neck and lower back pain, thyroid disease      Past Medical History:  Diagnosis Date  . Atypical chest pain   . Chronic abdominal pain   . Chronic lower back pain   . Chronic neck pain   . H. pylori infection   . Marijuana abuse, continuous   . Sciatica   . Thyroid disease     There are no problems to display for this patient.   Past Surgical History:  Procedure Laterality Date  . CESAREAN SECTION    . HERNIA REPAIR    . THYROIDECTOMY, PARTIAL       OB History   No obstetric history on file.     No family history on file.  Social History   Tobacco Use  . Smoking status: Current Every Day Smoker    Packs/day: 0.50  . Smokeless tobacco: Never Used  Vaping Use  . Vaping Use: Never used  Substance Use Topics  . Alcohol use: No  . Drug use: Yes    Types: Marijuana    Home Medications Prior to  Admission medications   Medication Sig Start Date End Date Taking? Authorizing Provider  levothyroxine (SYNTHROID, LEVOTHROID) 125 MCG tablet Take 1 tablet (125 mcg total) by mouth daily before breakfast. Patient taking differently: Take 125 mcg by mouth daily before breakfast. 06/01/18  Yes Gerhard Munch, MD  ibuprofen (ADVIL) 100 MG/5ML suspension Take 20 mLs (400 mg total) by mouth every 6 (six) hours. 07/09/19   Fawze, Mina A, PA-C  methocarbamol (ROBAXIN) 500 MG tablet Take 1 tablet (500 mg total) by mouth 2 (two) times daily. 04/28/19   Nira Conn, MD  fluticasone (FLONASE) 50 MCG/ACT nasal spray Place 2 sprays into both nostrils daily. 05/14/19 04/26/20  McVey, Madelaine Bhat, PA-C  mirtazapine (REMERON) 15 MG tablet Take 7.5 mg by mouth at bedtime. 08/22/19 04/26/20  [provider]  pantoprazole (PROTONIX) 20 MG tablet Take 2 tablets (40 mg total) by mouth daily for 7 days. 06/20/18 04/26/20  Alvira Monday, MD    Allergies    Hydrocodone-acetaminophen and Vicodin [hydrocodone-acetaminophen]  Review of Systems   Review of Systems  Constitutional: Negative for fever.  HENT: Negative for drooling, facial swelling and mouth sores.   Musculoskeletal: Positive for neck pain.  All other systems reviewed and are negative.   Physical Exam Updated Vital Signs BP (!) 126/94 (BP Location: Right Arm)  Pulse 84   Temp 98.4 F (36.9 C) (Oral)   Resp 16   Ht 5\' 6"  (1.676 m)   Wt 49.9 kg   SpO2 100%   BMI 17.75 kg/m   Physical Exam Vitals and nursing note reviewed.  Constitutional:      General: She is not in acute distress.    Appearance: She is normal weight.  HENT:     Head: Normocephalic and atraumatic.     Mouth/Throat:     Mouth: Mucous membranes are moist.     Dentition: Dental tenderness and dental caries present. No dental abscesses or gum lesions.     Tongue: No lesions.     Palate: No mass.     Pharynx: Oropharynx is clear.      Comments:  No facial swelling Neck:   Cardiovascular:     Rate and Rhythm: Normal rate.  Pulmonary:     Effort: Pulmonary effort is normal.  Musculoskeletal:     Cervical back: Normal range of motion and neck supple. Tenderness present.  Lymphadenopathy:     Cervical: Cervical adenopathy present.  Skin:    General: Skin is warm.  Neurological:     Mental Status: She is alert. Mental status is at baseline.  Psychiatric:        Mood and Affect: Mood normal.        Behavior: Behavior normal.     ED Results / Procedures / Treatments   Labs (all labs ordered are listed, but only abnormal results are displayed) Labs Reviewed - No data to display  EKG None  Radiology No results found.  Procedures Procedures   Medications Ordered in ED Medications  naproxen (NAPROSYN) tablet 500 mg (has no administration in time range)    ED Course  I have reviewed the triage vital signs and the nursing notes.  Pertinent labs & imaging results that were available during my care of the patient were reviewed by me and considered in my medical decision making (see chart for details).    MDM Rules/Calculators/A&P                          Pt with dental caries and no facial swelling.  No signs of ludwig's angina or difficulty swallowing and no systemic symptoms. Will treat with amoxicillin and have pt f/u with dentist.  Final Clinical Impression(s) / ED Diagnoses Final diagnoses:  Dental caries  Tooth pain    Rx / DC Orders ED Discharge Orders         Ordered    amoxicillin (AMOXIL) 500 MG capsule  3 times daily        10/03/20 0812    ibuprofen (IBU) 400 MG tablet  Every 6 hours PRN        10/03/20 10/05/20           0034, MD 10/03/20 [phone]

## 2021-01-26 ENCOUNTER — Other Ambulatory Visit: Payer: Self-pay

## 2021-01-26 ENCOUNTER — Emergency Department (HOSPITAL_BASED_OUTPATIENT_CLINIC_OR_DEPARTMENT_OTHER): Payer: Medicaid Other

## 2021-01-26 ENCOUNTER — Encounter (HOSPITAL_BASED_OUTPATIENT_CLINIC_OR_DEPARTMENT_OTHER): Payer: Self-pay

## 2021-01-26 ENCOUNTER — Emergency Department (HOSPITAL_BASED_OUTPATIENT_CLINIC_OR_DEPARTMENT_OTHER)
Admission: EM | Admit: 2021-01-26 | Discharge: 2021-01-26 | Disposition: A | Discharge status: Home / Self Care | Payer: Medicaid Other | Attending: Emergency Medicine | Admitting: Emergency Medicine

## 2021-01-26 DIAGNOSIS — D508 Other iron deficiency anemias: Secondary | ICD-10-CM | POA: Diagnosis not present

## 2021-01-26 DIAGNOSIS — F17210 Nicotine dependence, cigarettes, uncomplicated: Secondary | ICD-10-CM | POA: Diagnosis not present

## 2021-01-26 DIAGNOSIS — Z79899 Other long term (current) drug therapy: Secondary | ICD-10-CM | POA: Diagnosis not present

## 2021-01-26 DIAGNOSIS — M62838 Other muscle spasm: Secondary | ICD-10-CM | POA: Insufficient documentation

## 2021-01-26 DIAGNOSIS — M25512 Pain in left shoulder: Secondary | ICD-10-CM | POA: Diagnosis present

## 2021-01-26 DIAGNOSIS — E876 Hypokalemia: Secondary | ICD-10-CM | POA: Insufficient documentation

## 2021-01-26 LAB — CBC
HCT: 31.3 % — ABNORMAL LOW (ref 36.0–46.0)
Hemoglobin: 9.7 g/dL — ABNORMAL LOW (ref 12.0–15.0)
MCH: 25.7 pg — ABNORMAL LOW (ref 26.0–34.0)
MCHC: 31 g/dL (ref 30.0–36.0)
MCV: 83 fL (ref 80.0–100.0)
Platelets: 338 10*3/uL (ref 150–400)
RBC: 3.77 MIL/uL — ABNORMAL LOW (ref 3.87–5.11)
RDW: 16.8 % — ABNORMAL HIGH (ref 11.5–15.5)
WBC: 3.7 10*3/uL — ABNORMAL LOW (ref 4.0–10.5)
nRBC: 0 % (ref 0.0–0.2)

## 2021-01-26 LAB — BASIC METABOLIC PANEL
Anion gap: 10 (ref 5–15)
BUN: 8 mg/dL (ref 6–20)
CO2: 26 mmol/L (ref 22–32)
Calcium: 9 mg/dL (ref 8.9–10.3)
Chloride: 101 mmol/L (ref 98–111)
Creatinine, Ser: 0.8 mg/dL (ref 0.44–1.00)
GFR, Estimated: >60 mL/min (ref >60)
Glucose, Bld: 122 mg/dL — ABNORMAL HIGH (ref 70–99)
Potassium: 3 mmol/L — ABNORMAL LOW (ref 3.5–5.1)
Sodium: 137 mmol/L (ref 135–145)

## 2021-01-26 MED ORDER — KETOROLAC TROMETHAMINE 15 MG/ML IJ SOLN
15.0000 mg | Freq: Once | INTRAMUSCULAR | Status: AC
  Administered 2021-01-26: 15 mg via INTRAVENOUS
  Filled 2021-01-26: qty 1

## 2021-01-26 MED ORDER — POTASSIUM CHLORIDE CRYS ER 20 MEQ PO TBCR
40.0000 meq | EXTENDED_RELEASE_TABLET | Freq: Once | ORAL | Status: AC
  Administered 2021-01-26: 40 meq via ORAL
  Filled 2021-01-26: qty 2

## 2021-01-26 NOTE — ED Triage Notes (Signed)
Chest pain started app 15 minutes prior to arrival.

## 2021-01-26 NOTE — ED Provider Notes (Signed)
[facility] EMERGENCY DEPARTMENT Provider Note  CSN: [id number] Arrival date & time: 01/26/21 0457  Chief Complaint(s) Chest Pain  HPI Kelli Bowman is a 35 y.o. female with a past medical history listed below who presents to the emergency department with sudden onset left pectoral pain that began approximately 20 minutes prior to arrival.  Patient reports that she awoke and smoked a blunts.  She then began having pain described as sharp and pressure.  No radiating.  No exertional.  Worse with movement and certain positions.  Also worse with palpation.  No shortness of breath.  No coughing or congestion.  No recent fevers or infections.  No nausea or vomiting.  No abdominal pain.  Patient has had similar presentations.    Chest Pain  Past Medical History Past Medical History:  Diagnosis Date   Atypical chest pain    Chronic abdominal pain    Chronic lower back pain    Chronic neck pain    H. pylori infection    Marijuana abuse, continuous    Sciatica    Thyroid disease    There are no problems to display for this patient.  Home Medication(s) Prior to Admission medications   Medication Sig Start Date End Date Taking? Authorizing Provider  amoxicillin (AMOXIL) 500 MG capsule Take 1 capsule (500 mg total) by mouth 3 (three) times daily. 10/03/20   Gwyneth Sprout, MD  ibuprofen (IBU) 400 MG tablet Take 1 tablet (400 mg total) by mouth every 6 (six) hours as needed. 10/03/20   Gwyneth Sprout, MD  levothyroxine (SYNTHROID, LEVOTHROID) 125 MCG tablet Take 1 tablet (125 mcg total) by mouth daily before breakfast. Patient taking differently: Take 125 mcg by mouth daily before breakfast. 06/01/18   Gerhard Munch, MD  methocarbamol (ROBAXIN) 500 MG tablet Take 1 tablet (500 mg total) by mouth 2 (two) times daily. 04/28/19   Nira Conn, MD  fluticasone (FLONASE) 50 MCG/ACT nasal spray Place 2 sprays into both nostrils daily. 05/14/19 04/26/20  McVey,  Madelaine Bhat, PA-C  mirtazapine (REMERON) 15 MG tablet Take 7.5 mg by mouth at bedtime. 08/22/19 04/26/20  [provider]  pantoprazole (PROTONIX) 20 MG tablet Take 2 tablets (40 mg total) by mouth daily for 7 days. 06/20/18 04/26/20  Alvira Monday, MD                                                                                                                                    Past Surgical History Past Surgical History:  Procedure Laterality Date   CESAREAN SECTION     HERNIA REPAIR     THYROIDECTOMY, PARTIAL     Family History History reviewed. No pertinent family history.  Social History Social History   Tobacco Use   Smoking status: Every Day    Packs/day: 0.50    Types: Cigarettes   Smokeless tobacco: Never  Vaping Use   Vaping Use: Never used  Substance Use Topics   Alcohol use: No   Drug use: Yes    Types: Marijuana   Allergies Hydrocodone-acetaminophen and Vicodin [hydrocodone-acetaminophen]  Review of Systems Review of Systems  Cardiovascular:  Positive for chest pain.  All other systems are reviewed and are negative for acute change except as noted in the HPI  Physical Exam Vital Signs  I have reviewed the triage vital signs BP (!) 151/101 (BP Location: Right Arm)   Pulse 86   Temp 97.7 F (36.5 C) (Oral)   Resp 20   Ht 5\' 6"  (1.676 m)   Wt 49.9 kg   LMP 01/24/2021   SpO2 100%   BMI 17.75 kg/m   Physical Exam Vitals reviewed.  Constitutional:      General: She is not in acute distress.    Appearance: She is well-developed. She is not diaphoretic.  HENT:     Head: Normocephalic and atraumatic.     Nose: Nose normal.  Eyes:     General: No scleral icterus.       Right eye: No discharge.        Left eye: No discharge.     Conjunctiva/sclera: Conjunctivae normal.     Pupils: Pupils are equal, round, and reactive to light.  Cardiovascular:     Rate and Rhythm: Normal rate and regular rhythm.     Heart sounds: No murmur  heard.   No friction rub. No gallop.  Pulmonary:     Effort: Pulmonary effort is normal. No respiratory distress.     Breath sounds: Normal breath sounds. No stridor. No rales.    Chest:     Chest wall: Tenderness present.    Abdominal:     General: There is no distension.     Palpations: Abdomen is soft.     Tenderness: There is no abdominal tenderness.  Musculoskeletal:        General: No tenderness.     Cervical back: Normal range of motion and neck supple.  Skin:    General: Skin is warm and dry.     Findings: No erythema or rash.  Neurological:     Mental Status: She is alert and oriented to person, place, and time.    ED Results and Treatments Labs (all labs ordered are listed, but only abnormal results are displayed) Labs Reviewed  BASIC METABOLIC PANEL - Abnormal; Notable for the following components:      Result Value   Potassium 3.0 (*)    Glucose, Bld 122 (*)    All other components within normal limits  CBC - Abnormal; Notable for the following components:   WBC 3.7 (*)    RBC 3.77 (*)    Hemoglobin 9.7 (*)    HCT 31.3 (*)    MCH 25.7 (*)    RDW 16.8 (*)    All other components within normal limits  PREGNANCY, URINE  EKG  EKG Interpretation  Date/Time:  Tuesday January 26 2021 05:15:45 EDT Ventricular Rate:  90 PR Interval:  210 QRS Duration: 82 QT Interval:  423 QTC Calculation: 518 R Axis:   89 Text Interpretation: Sinus rhythm Prolonged PR interval Prolonged QT interval Otherwise no significant change Confirmed by Drema Pry [phone]) on 01/26/2021 5:26:06 AM       Radiology DG Chest 2 View  Result Date: 01/26/2021 CLINICAL DATA:  Chest pain. EXAM: CHEST - 2 VIEW COMPARISON:  Chest x-ray dated July 09, 2019. FINDINGS: The heart size and mediastinal contours are within normal limits. Both lungs are clear. Bilateral nipple  shadows again noted. The visualized skeletal structures are unremarkable. IMPRESSION: No active cardiopulmonary disease. Electronically Signed   By: Obie Dredge M.D.   On: 01/26/2021 06:33    Pertinent labs & imaging results that were available during my care of the patient were reviewed by me and considered in my medical decision making (see MDM for details).  Medications Ordered in ED Medications  potassium chloride SA (KLOR-CON) CR tablet 40 mEq (has no administration in time range)  ketorolac (TORADOL) 15 MG/ML injection 15 mg (15 mg Intravenous Given 01/26/21 0541)                                                                                                                                     Procedures Procedures  (including critical care time)  Medical Decision Making / ED Course I have reviewed the nursing notes for this encounter and the patient's prior records (if available in EHR or on provided paperwork).  Kelli Bowman was evaluated in Emergency Department on 01/26/2021 for the symptoms described in the history of present illness. She was evaluated in the context of the global COVID-19 pandemic, which necessitated consideration that the patient might be at risk for infection with the SARS-CoV-2 virus that causes COVID-19. Institutional protocols and algorithms that pertain to the evaluation of patients at risk for COVID-19 are in a state of rapid change based on information released by regulatory bodies including the CDC and federal and state organizations. These policies and algorithms were followed during the patient's care in the ED.     Patient is here with muscular pain in the left shoulder girdle region. Completely reproducible on palpation and with range of motion of the left shoulder/arm. EKG without acute ischemic changes or evidence of pericarditis. Highly inconsistent with ACS.  Doubt cardiac process. Low suspicion for pulmonary embolism and patient is PERC  negative.. Not consistent with aortic dissection or esophageal perforation..   Pertinent labs & imaging results that were available during my care of the patient were reviewed by me and considered in my medical decision making:  Chest x-ray without evidence suggestive of pneumonia, pneumothorax, pneumomediastinum.  No abnormal contour of the mediastinum to suggest dissection. No evidence of acute injuries. CBC without leukocytosis.  Mild anemia but close to her  baseline.  She reports that she has not been taking her iron supplements. Metabolic panel notable for hypokalemia.  Close to her baseline.  Reports that she has not been taking her supplements.  Pain resolved after Toradol.  Final Clinical Impression(s) / ED Diagnoses Final diagnoses:  Muscle spasm of shoulder region  Hypokalemia  Other iron deficiency anemia   The patient appears reasonably screened and/or stabilized for discharge and I doubt any other medical condition or other [facility] requiring further screening, evaluation, or treatment in the ED at this time prior to discharge. Safe for discharge with strict return precautions.  Disposition: Discharge  Condition: Good  I have discussed the results, Dx and Tx plan with the patient/family who expressed understanding and agree(s) with the plan. Discharge instructions discussed at length. The patient/family was given strict return precautions who verbalized understanding of the instructions. No further questions at time of discharge.    ED Discharge Orders     None         Follow Up: Inc, [facility] [address] [place] [address] [phone]  Call  to schedule an appointment for close follow up    This chart was dictated using voice recognition software.  Despite best efforts to proofread,  errors can occur which can change the documentation meaning.    Nira Conn, MD 01/26/21 [phone]

## 2021-06-02 IMAGING — CR DG ABDOMEN ACUTE W/ 1V CHEST
3 series · 3 of 3 positions shown · non-contrast
Comparison: 07/04/2018 chest radiograph. 02/13/2018 abdominal
radiograph

CLINICAL DATA: Lower abdominal pain, chronic, worsening. History of
umbilical hernia repair.

EXAM:
DG ABDOMEN ACUTE W/ 1V CHEST

[w chest pa]
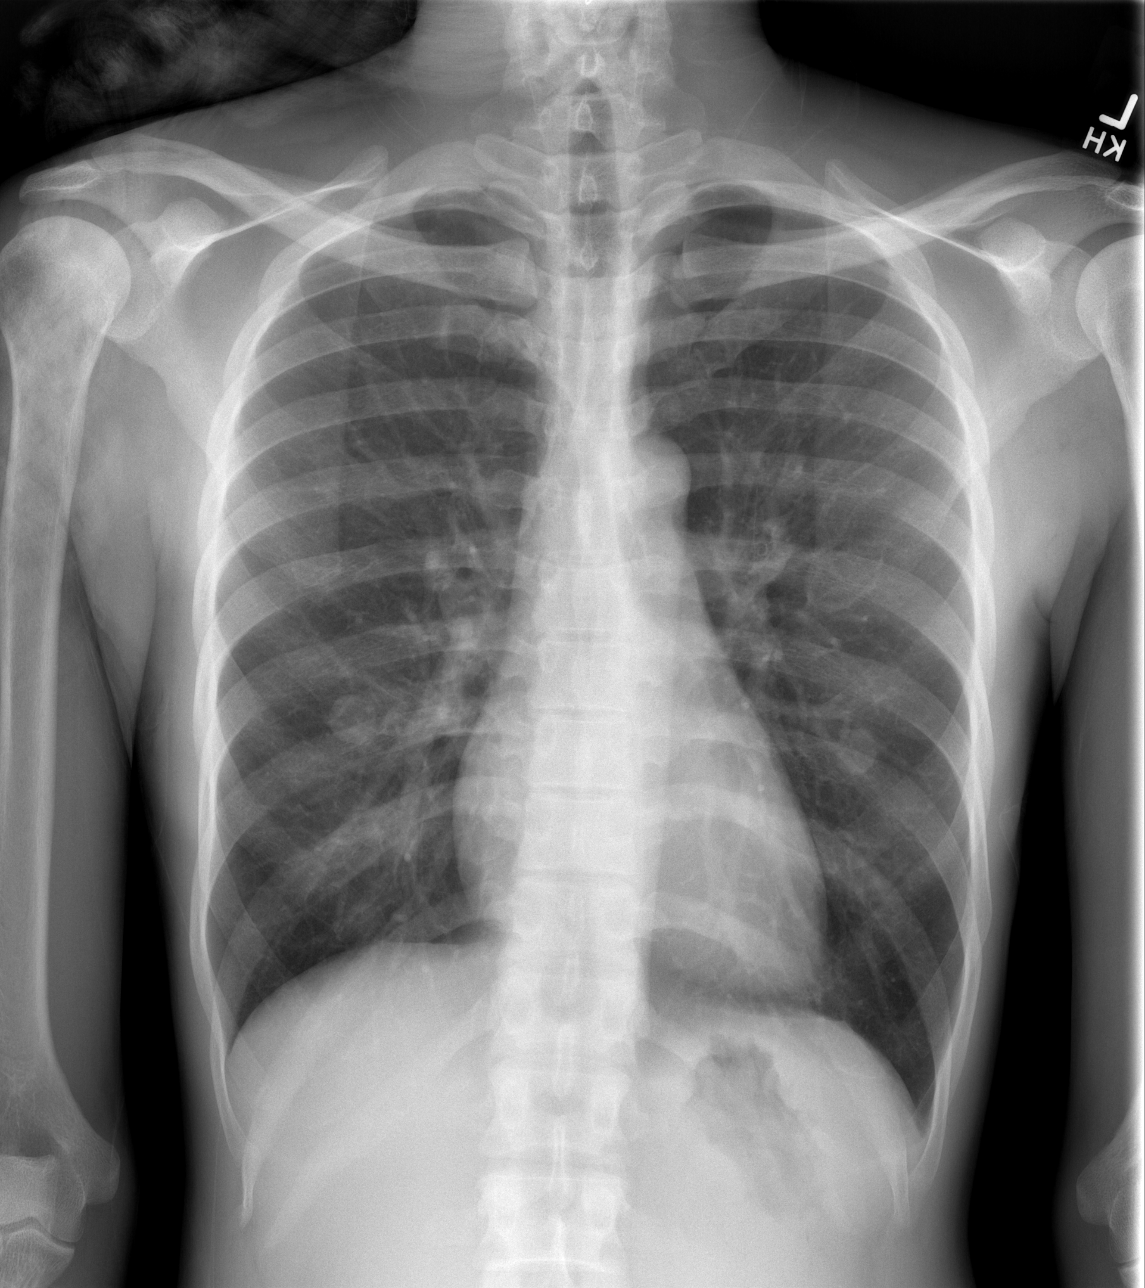

[w abdomen upright]
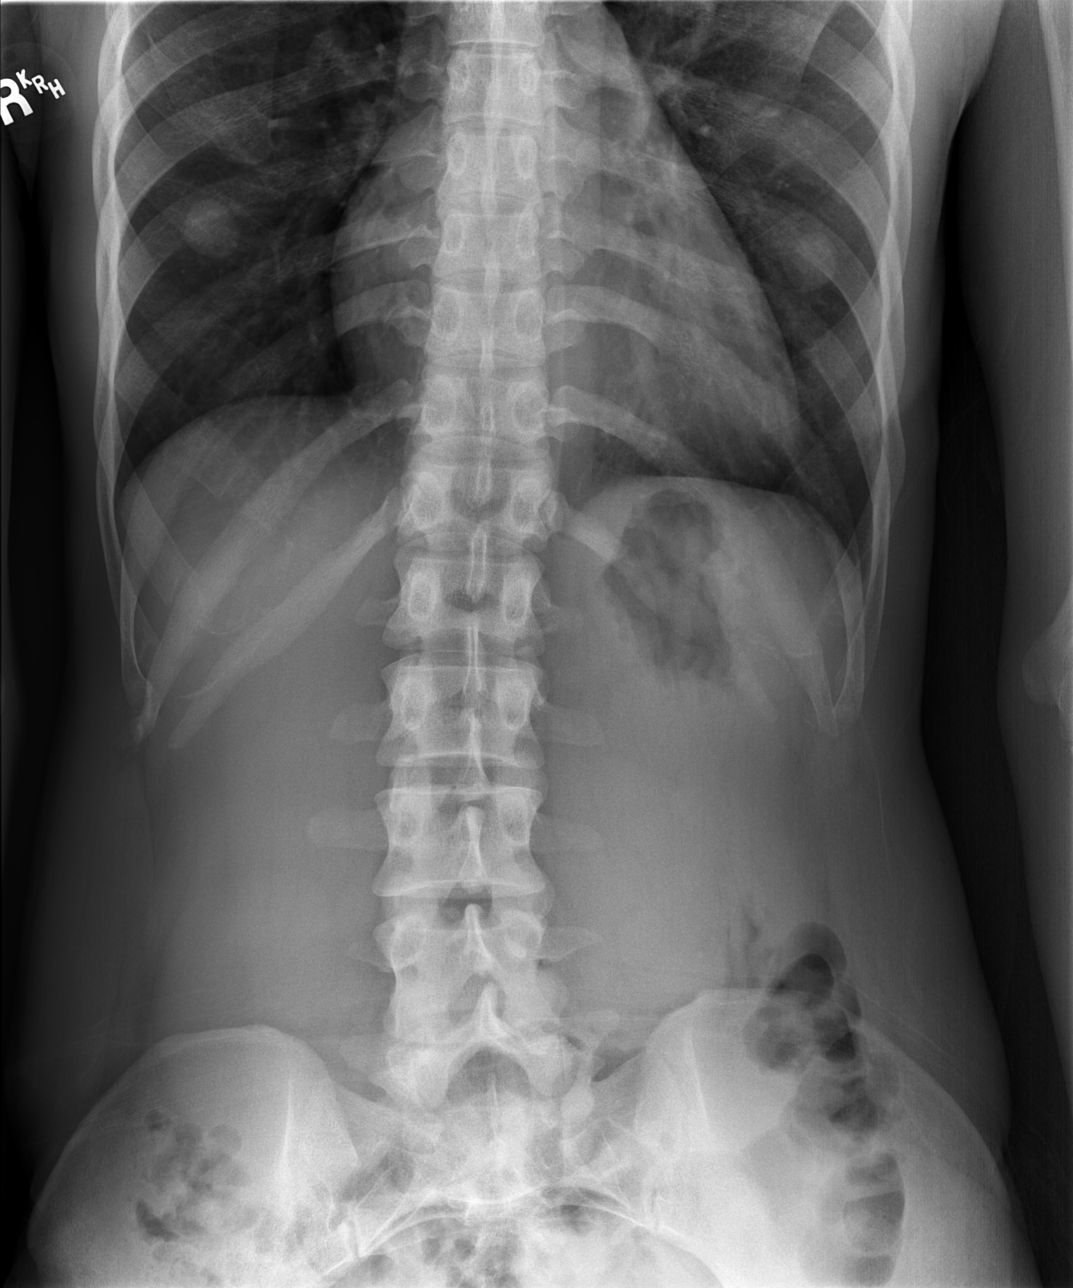

[t abdomen supine]
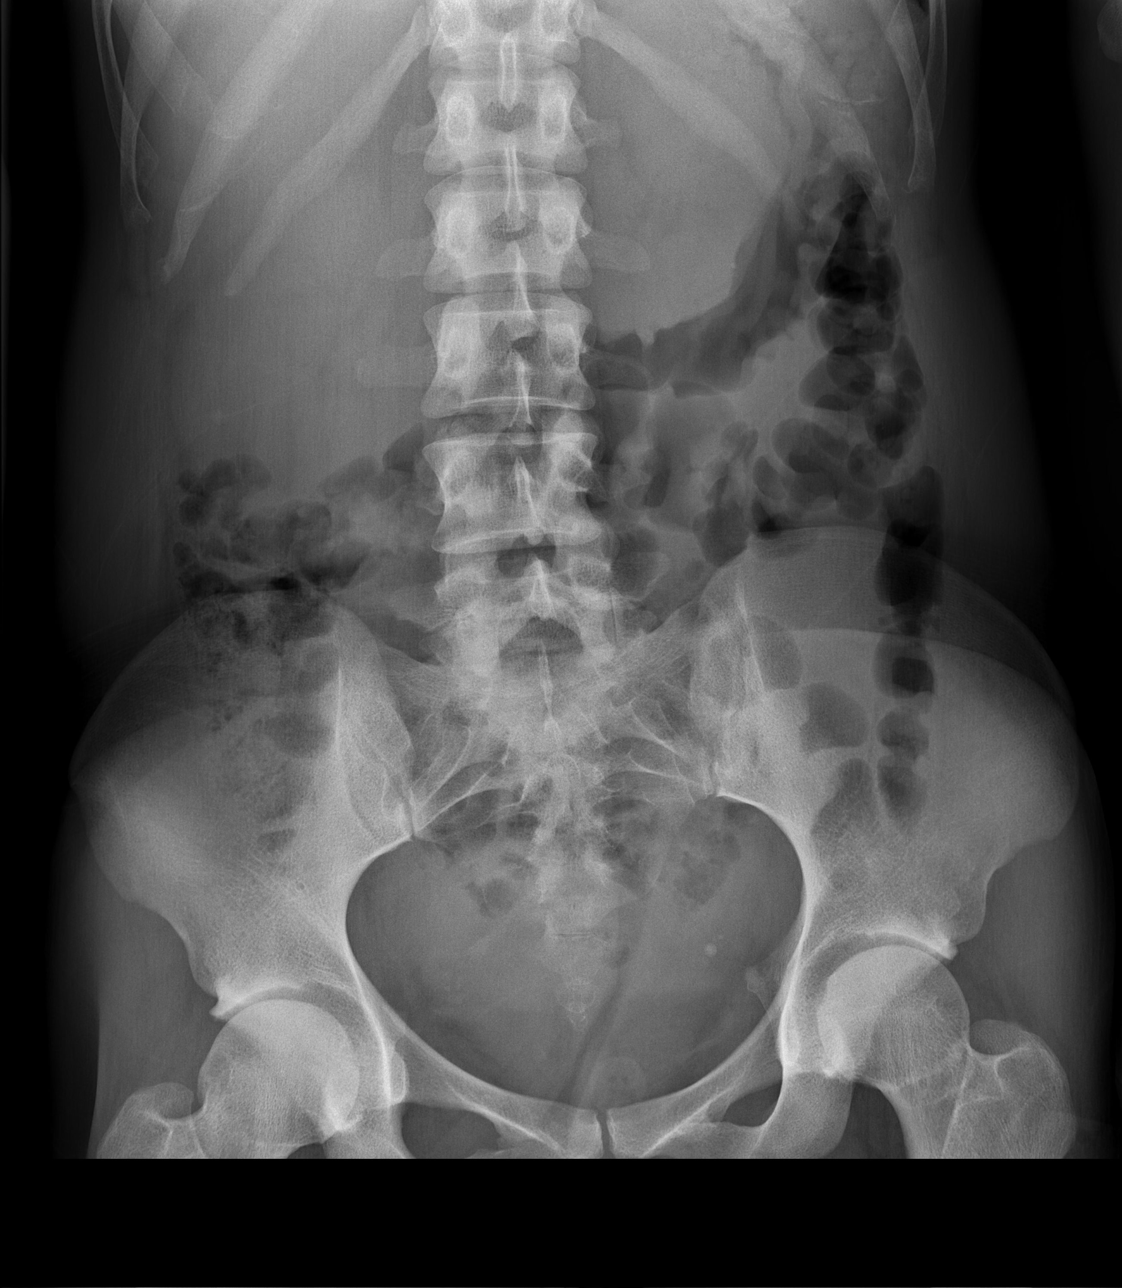

[3 of 3 positions shown; findings below may reference images not displayed]

FINDINGS: Stable cardiomediastinal silhouette with normal heart size. No
pneumothorax. No pleural effusion. Lungs appear clear, with no acute
consolidative airspace disease and no pulmonary edema. No
disproportionately dilated small bowel loops. No evidence of
pneumatosis or pneumoperitoneum. Mild colonic stool volume. Stable
punctate calcification overlying the lower left kidney.
IMPRESSION: 1. No active cardiopulmonary disease.
2. Nonobstructive bowel gas pattern.
3. Stable punctate calcification overlying the lower left kidney,
cannot exclude tiny left renal stone.

## 2022-06-23 IMAGING — CT CT ABD-PELV W/ CM
2 of 4 series · 16 of 46 positions shown, 18 images · IV contrast (omnipaque)
Comparison: June 20, 2018

CLINICAL DATA: Right lower abdominal pain

EXAM:
CT ABDOMEN AND PELVIS WITH CONTRAST
TECHNIQUE: Multidetector CT imaging of the abdomen and pelvis was performed
using the standard protocol following bolus administration of
intravenous contrast. Oral contrast also administered.
CONTRAST:  100mL OMNIPAQUE IOHEXOL 300 MG/ML  SOLN

[Series 2: axial st · axial · 0.78mm/px · z∈[+670,+1045]mm · 13 of 83 slices shown, 15 images]
[im 4/83  soft-tissue]
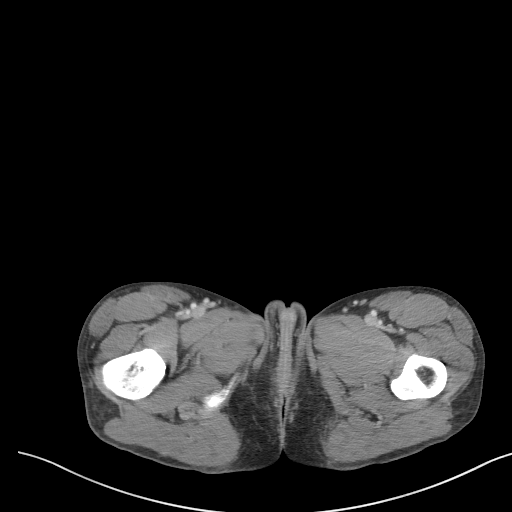
[im 4/83  bone]
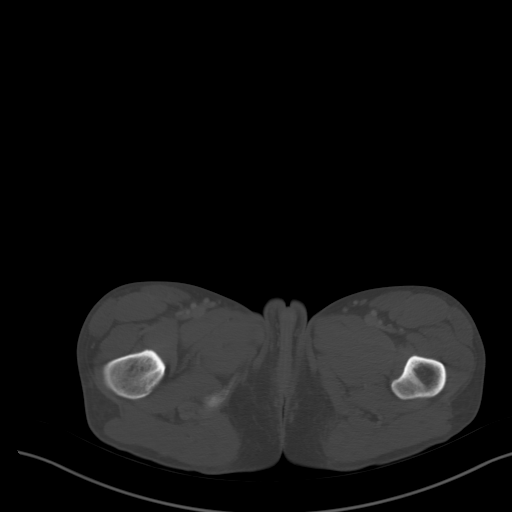
[im 10/83  soft-tissue]
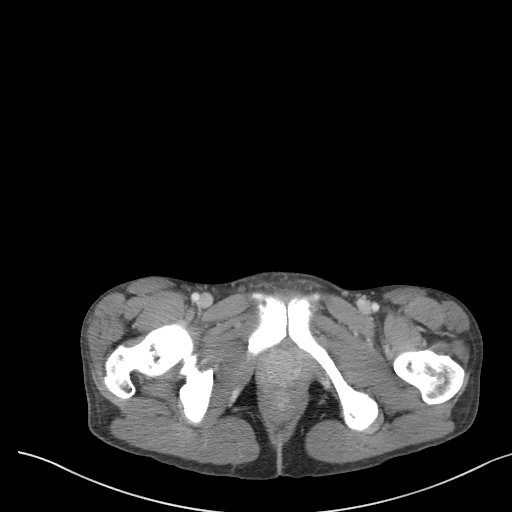
[im 16/83  soft-tissue]
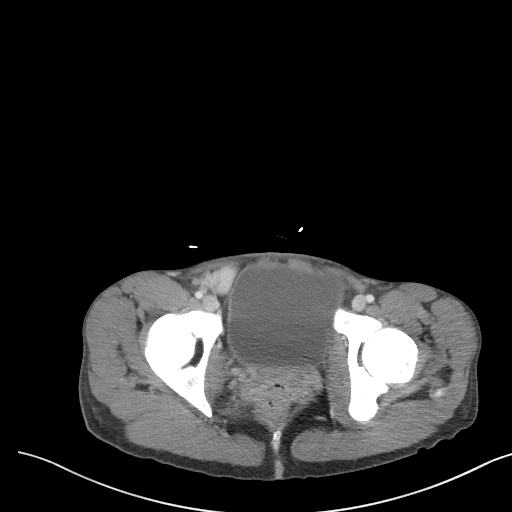
[im 23/83  soft-tissue]
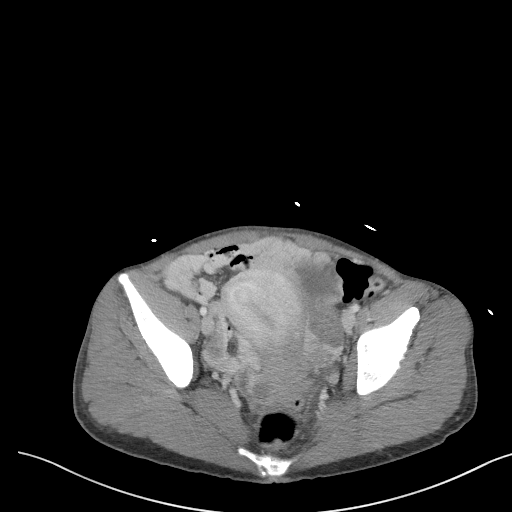
[im 29/83  soft-tissue]
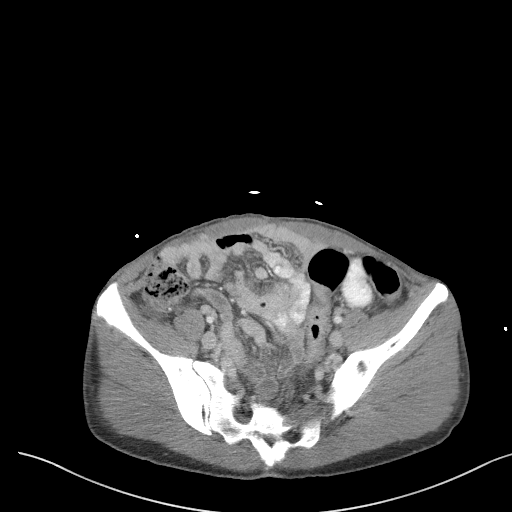
[im 35/83  soft-tissue]
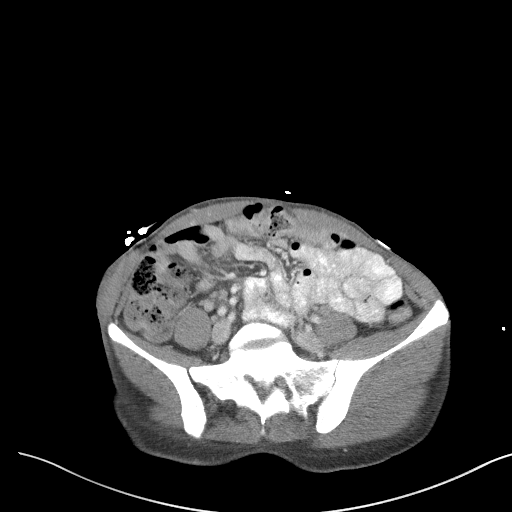
[im 42/83  soft-tissue]
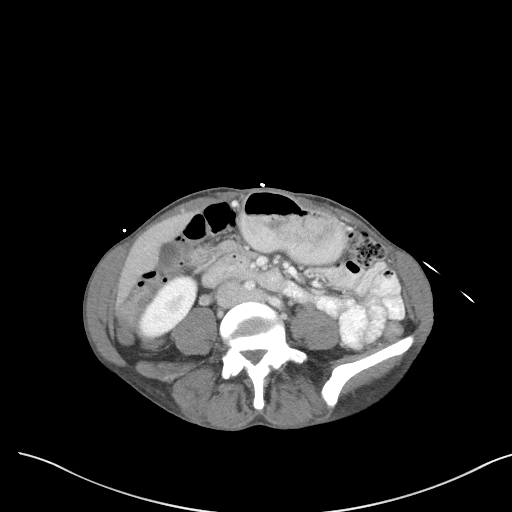
[im 48/83  soft-tissue]
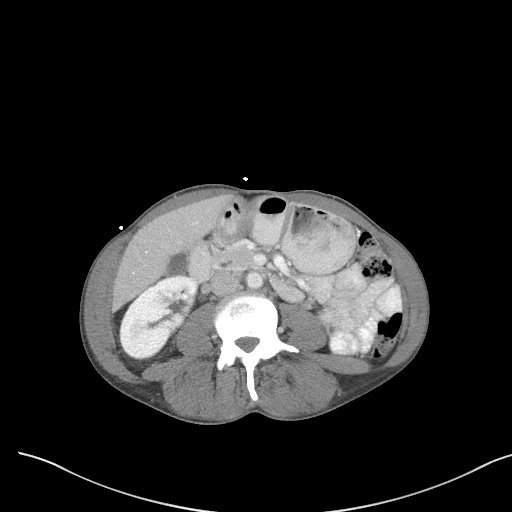
[im 54/83  soft-tissue]
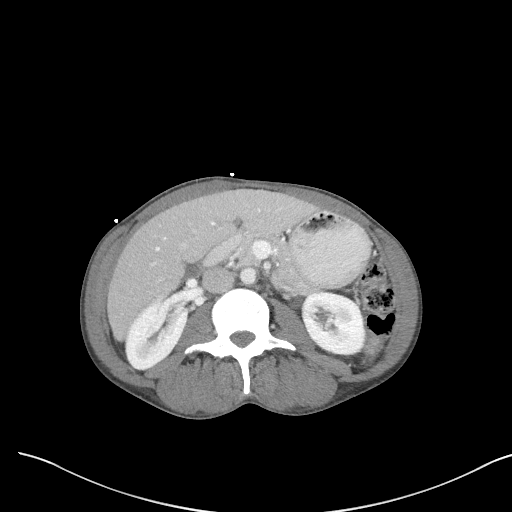
[im 54/83  bone]
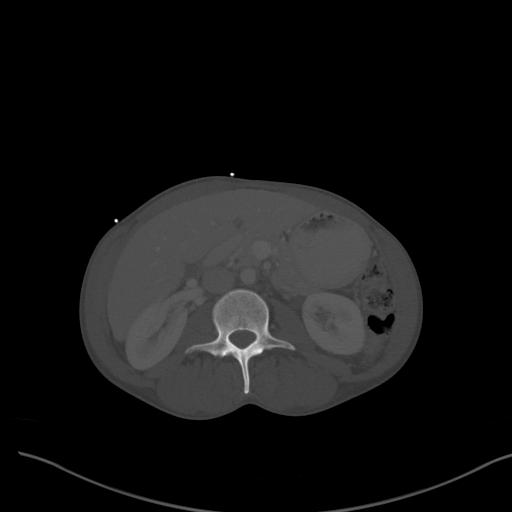
[im 60/83  soft-tissue]
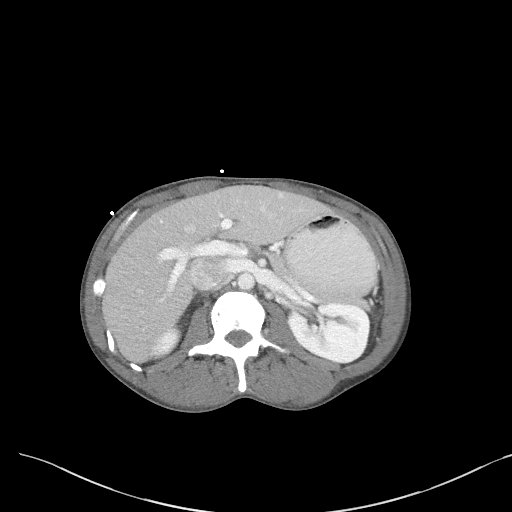
[im 67/83  soft-tissue]
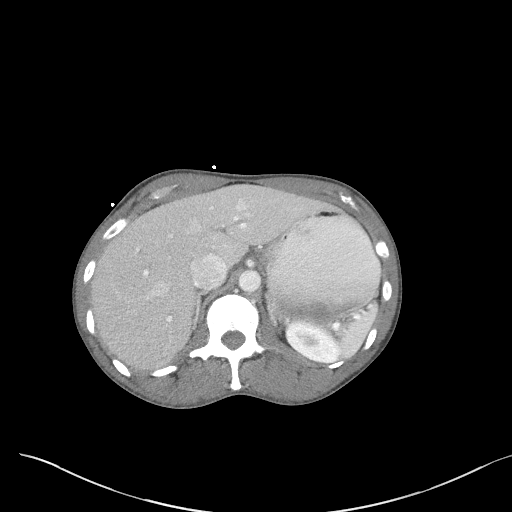
[im 73/83  soft-tissue]
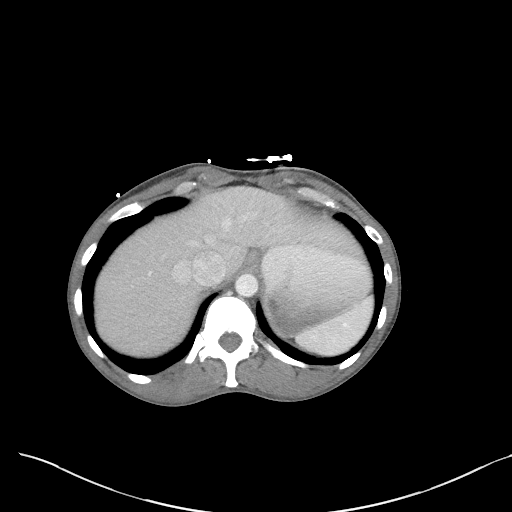
[im 79/83  soft-tissue]
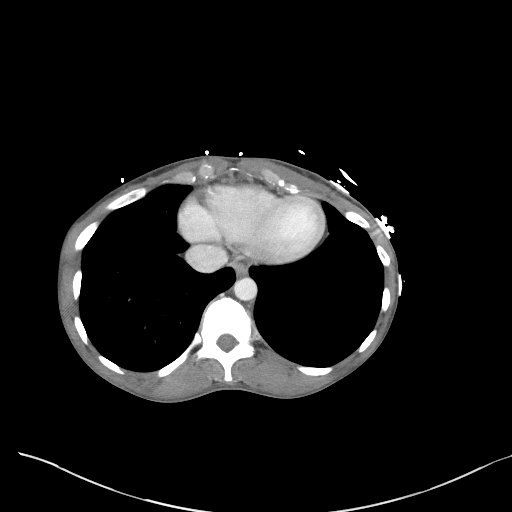

[Series 5: coronal st · coronal · 0.68mm/px · 3 of 81 slices shown]
[im 27/81  soft-tissue]
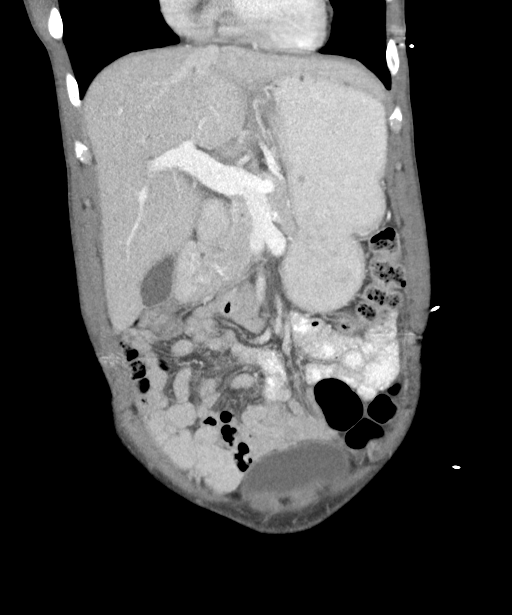
[im 36/81  soft-tissue]
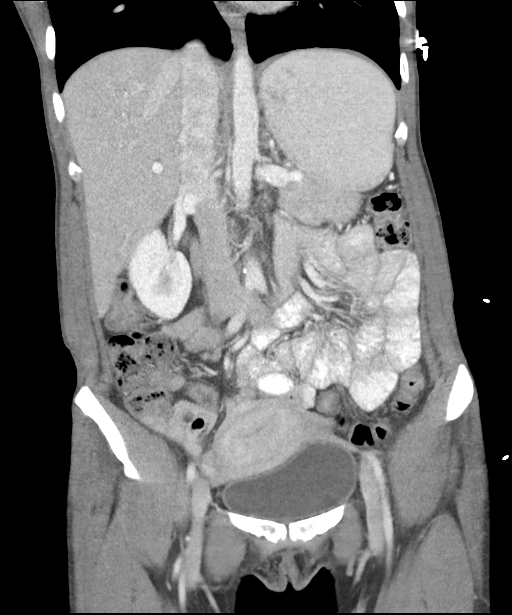
[im 45/81  soft-tissue]
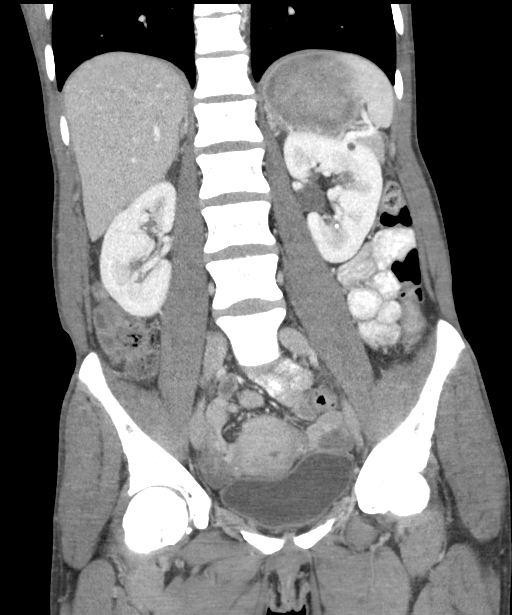

[16 of 46 positions shown; findings below may reference images not displayed]

FINDINGS: Lower chest: Lung bases are clear.

Hepatobiliary: There is a degree of hepatic steatosis. No focal
liver lesions are appreciable. Gallbladder wall is not appreciably
thickened. There is no biliary duct dilatation.

Pancreas: No pancreatic mass or inflammatory focus.

Spleen: No splenic lesions are evident.

Adrenals/Urinary Tract: Adrenals bilaterally appear normal. There is
a 4 mm cyst in the anterior upper left kidney. There is an
extrarenal pelvis on each side, an anatomic variant. There is no
evident hydronephrosis on either side. There is a 2 mm calculus in
the lower pole of the left kidney, stable. There is no evident
ureteral calculus on either side. Urinary bladder is midline with
wall thickness within normal limits.

Stomach/Bowel: There is no appreciable bowel wall or mesenteric
thickening. There is no demonstrable bowel obstruction. The terminal
ileum appears normal. There is no evident free air or portal venous
air. Stomach is mildly distended fluid and food material.

Vascular/Lymphatic: No evident abdominal aortic aneurysm. No
appreciable arterial vascular lesions. Major venous structures
appear patent. No evident adenopathy in the abdomen or pelvis.

Reproductive: Uterus is anteverted. There is an area enhancement in
the uterus anteriorly measuring 1.8 x 1.4 cm, a probable small
leiomyoma. No adnexal lesions are evident.

Other: No periappendiceal region inflammation. Appendix is less than
optimally seen and appears diminutive. No abscess or ascites evident
in the abdomen or pelvis. Evidence of previous ventral hernia
repair.

Musculoskeletal: No blastic or lytic bone lesions. No intramuscular
lesions.
IMPRESSION: 1. No bowel wall thickening or bowel obstruction. No abscess in the
abdomen or pelvis. No appendiceal region inflammation.

2. Probable leiomyoma in the anterior uterus measuring 1.8 x 1.4 cm.

3.  Hepatic steatosis.

4. Stable 2 mm calculus lower pole left kidney. No hydronephrosis or
ureteral calculus on either side. Urinary bladder wall thickness
normal.

5.  Postoperative midline abdominal wall hernia repair.

## 2023-04-19 ENCOUNTER — Ambulatory Visit: Payer: 59 | Admitting: Physician Assistant

## 2023-08-18 ENCOUNTER — Telehealth: Admitting: Physician Assistant

## 2023-08-18 DIAGNOSIS — B9689 Other specified bacterial agents as the cause of diseases classified elsewhere: Secondary | ICD-10-CM | POA: Diagnosis not present

## 2023-08-18 DIAGNOSIS — J019 Acute sinusitis, unspecified: Secondary | ICD-10-CM

## 2023-08-18 DIAGNOSIS — J301 Allergic rhinitis due to pollen: Secondary | ICD-10-CM

## 2023-08-18 MED ORDER — DOXYCYCLINE HYCLATE 100 MG PO TABS: 100.0000 mg | ORAL_TABLET | Freq: Two times a day (BID) | ORAL | 0 refills | Status: AC

## 2023-08-18 MED ORDER — CETIRIZINE HCL 10 MG PO TABS
10.0000 mg | ORAL_TABLET | Freq: Every day | ORAL | 11 refills | Status: AC
Start: 2023-08-18 — End: ?

## 2023-08-18 NOTE — Patient Instructions (Signed)
 Kelli Bowman, thank you for joining Kelli Loveless, PA-C for today's virtual visit.  While this provider is not your primary care provider (PCP), if your PCP is located in our provider database this encounter information will be shared with them immediately following your visit.   A [facility] MyChart account gives you access to today's visit and all your visits, tests, and labs performed at [facility] " click here if you don't have a [facility] MyChart account or go to [email or web address]  Consent: (Patient) Kelli Bowman provided verbal consent for this virtual visit at the beginning of the encounter.  Current Medications:  Current Outpatient Medications:    cetirizine (ZYRTEC) 10 MG tablet, Take 1 tablet (10 mg total) by mouth daily., Disp: 30 tablet, Rfl: 11   doxycycline (VIBRA-TABS) 100 MG tablet, Take 1 tablet (100 mg total) by mouth 2 (two) times daily., Disp: 20 tablet, Rfl: 0   ibuprofen (IBU) 400 MG tablet, Take 1 tablet (400 mg total) by mouth every 6 (six) hours as needed., Disp: 20 tablet, Rfl: 0   levothyroxine (SYNTHROID, LEVOTHROID) 125 MCG tablet, Take 1 tablet (125 mcg total) by mouth daily before breakfast. (Patient taking differently: Take 125 mcg by mouth daily before breakfast.), Disp: 30 tablet, Rfl: 0   methocarbamol (ROBAXIN) 500 MG tablet, Take 1 tablet (500 mg total) by mouth 2 (two) times daily., Disp: 10 tablet, Rfl: 0   Medications ordered in this encounter:  Meds ordered this encounter  Medications   doxycycline (VIBRA-TABS) 100 MG tablet    Sig: Take 1 tablet (100 mg total) by mouth 2 (two) times daily.    Dispense:  20 tablet    Refill:  0    Supervising Provider:   Merrilee Jansky [[id number]]   cetirizine (ZYRTEC) 10 MG tablet    Sig: Take 1 tablet (10 mg total) by mouth daily.    Dispense:  30 tablet    Refill:  11    Supervising Provider:   Merrilee Jansky [phone]     *If you need refills on other medications  prior to your next appointment, please contact your pharmacy*  Follow-Up: Call back or seek an in-person evaluation if the symptoms worsen or if the condition fails to improve as anticipated.  [facility] [phone]  Other Instructions Sinus Infection, Adult A sinus infection, also called sinusitis, is inflammation of your sinuses. Sinuses are hollow spaces in the bones around your face. Your sinuses are located: Around your eyes. In the middle of your forehead. Behind your nose. In your cheekbones. Mucus normally drains out of your sinuses. When your nasal tissues become inflamed or swollen, mucus can become trapped or blocked. This allows bacteria, viruses, and fungi to grow, which leads to infection. Most infections of the sinuses are caused by a virus. A sinus infection can develop quickly. It can last for up to 4 weeks (acute) or for more than 12 weeks (chronic). A sinus infection often develops after a cold. What are the causes? This condition is caused by anything that creates swelling in the sinuses or stops mucus from draining. This includes: Allergies. Asthma. Infection from bacteria or viruses. Deformities or blockages in your nose or sinuses. Abnormal growths in the nose (nasal polyps). Pollutants, such as chemicals or irritants in the air. Infection from fungi. This is rare. What increases the risk? You are more likely to develop this condition if you: Have a weak body defense system (immune system). Do a  lot of swimming or diving. Overuse nasal sprays. Smoke. What are the signs or symptoms? The main symptoms of this condition are pain and a feeling of pressure around the affected sinuses. Other symptoms include: Stuffy nose or congestion that makes it difficult to breathe through your nose. Thick yellow or greenish drainage from your nose. Tenderness, swelling, and warmth over the affected sinuses. A cough that may get worse at night. Decreased  sense of smell and taste. Extra mucus that collects in the throat or the back of the nose (postnasal drip) causing a sore throat or bad breath. Tiredness (fatigue). Fever. How is this diagnosed? This condition is diagnosed based on: Your symptoms. Your medical history. A physical exam. Tests to find out if your condition is acute or chronic. This may include: Checking your nose for nasal polyps. Viewing your sinuses using a device that has a light (endoscope). Testing for allergies or bacteria. Imaging tests, such as an MRI or CT scan. In rare cases, a bone biopsy may be done to rule out more serious types of fungal sinus disease. How is this treated? Treatment for a sinus infection depends on the cause and whether your condition is chronic or acute. If caused by a virus, your symptoms should go away on their own within 10 days. You may be given medicines to relieve symptoms. They include: Medicines that shrink swollen nasal passages (decongestants). A spray that eases inflammation of the nostrils (topical intranasal corticosteroids). Rinses that help get rid of thick mucus in your nose (nasal saline washes). Medicines that treat allergies (antihistamines). Over-the-counter pain relievers. If caused by bacteria, your health care provider may recommend waiting to see if your symptoms improve. Most bacterial infections will get better without antibiotic medicine. You may be given antibiotics if you have: A severe infection. A weak immune system. If caused by narrow nasal passages or nasal polyps, surgery may be needed. Follow these instructions at home: Medicines Take, use, or apply over-the-counter and prescription medicines only as told by your health care provider. These may include nasal sprays. If you were prescribed an antibiotic medicine, take it as told by your health care provider. Do not stop taking the antibiotic even if you start to feel better. Hydrate and humidify  Drink  enough fluid to keep your urine pale yellow. Staying hydrated will help to thin your mucus. Use a cool mist humidifier to keep the humidity level in your home above 50%. Inhale steam for 10-15 minutes, 3-4 times a day, or as told by your health care provider. You can do this in the bathroom while a hot shower is running. Limit your exposure to cool or dry air. Rest Rest as much as possible. Sleep with your head raised (elevated). Make sure you get enough sleep each night. General instructions  Apply a warm, moist washcloth to your face 3-4 times a day or as told by your health care provider. This will help with discomfort. Use nasal saline washes as often as told by your health care provider. Wash your hands often with soap and water to reduce your exposure to germs. If soap and water are not available, use hand sanitizer. Do not smoke. Avoid being around people who are smoking (secondhand smoke). Keep all follow-up visits. This is important. Contact a health care provider if: You have a fever. Your symptoms get worse. Your symptoms do not improve within 10 days. Get help right away if: You have a severe headache. You have persistent vomiting. You have  severe pain or swelling around your face or eyes. You have vision problems. You develop confusion. Your neck is stiff. You have trouble breathing. These symptoms may be an emergency. Get help right away. Call 911. Do not wait to see if the symptoms will go away. Do not drive yourself to the hospital. Summary A sinus infection is soreness and inflammation of your sinuses. Sinuses are hollow spaces in the bones around your face. This condition is caused by nasal tissues that become inflamed or swollen. The swelling traps or blocks the flow of mucus. This allows bacteria, viruses, and fungi to grow, which leads to infection. If you were prescribed an antibiotic medicine, take it as told by your health care provider. Do not stop taking the  antibiotic even if you start to feel better. Keep all follow-up visits. This is important. This information is not intended to replace advice given to you by your health care provider. Make sure you discuss any questions you have with your health care provider. Document Revised: 05/04/2021 Document Reviewed: 05/04/2021 Elsevier Patient Education  2024 [employer]   If you have been instructed to have an in-person evaluation today at a local Urgent Care facility, please use the link below. It will take you to a list of all of our available [facility], including address, phone number and hours of operation. Please do not delay care.  [facility]  If you or a family member do not have a primary care provider, use the link below to schedule a visit and establish care. When you choose a [facility] primary care physician or advanced practice provider, you gain a long-term partner in health. Find a Primary Care Provider  Learn more about [facility]'s in-office and virtual care options: [facility] - Get Care Now

## 2023-08-18 NOTE — Progress Notes (Signed)
 Virtual Visit Consent   Kelli Bowman, you are scheduled for a virtual visit with a [facility] provider today. Just as with appointments in the office, your consent must be obtained to participate. Your consent will be active for this visit and any virtual visit you may have with one of our providers in the next 365 days. If you have a MyChart account, a copy of this consent can be sent to you electronically.  As this is a virtual visit, video technology does not allow for your provider to perform a traditional examination. This may limit your provider's ability to fully assess your condition. If your provider identifies any concerns that need to be evaluated in person or the need to arrange testing (such as labs, EKG, etc.), we will make arrangements to do so. Although advances in technology are sophisticated, we cannot ensure that it will always work on either your end or our end. If the connection with a video visit is poor, the visit may have to be switched to a telephone visit. With either a video or telephone visit, we are not always able to ensure that we have a secure connection.  By engaging in this virtual visit, you consent to the provision of healthcare and authorize for your insurance to be billed (if applicable) for the services provided during this visit. Depending on your insurance coverage, you may receive a charge related to this service.  I need to obtain your verbal consent now. Are you willing to proceed with your visit today? Labrina Lines has provided verbal consent on 08/18/2023 for a virtual visit (video or telephone). Margaretann Loveless, PA-C  Date: 08/18/2023 3:56 PM   Virtual Visit via Video Note   I, Margaretann Loveless, connected with  Kelli Bowman  ([id number], [birth year]) on 08/18/23 at  3:45 PM EST by a video-enabled telemedicine application and verified that I am speaking with the correct person using two identifiers.  Location: Patient: Virtual Visit  Location Patient: Home Provider: Virtual Visit Location Provider: Home Office   I discussed the limitations of evaluation and management by telemedicine and the availability of in person appointments. The patient expressed understanding and agreed to proceed.    History of Present Illness: Kelli Bowman is a 38 y.o. who identifies as a female who was assigned female at birth, and is being seen today for sinus congestion.  HPI: Sinusitis This is a new problem. The current episode started 1 to 4 weeks ago (2 weeks). The problem is unchanged. There has been no fever. Associated symptoms include congestion, coughing, ear pain (left), headaches and sinus pressure. Pertinent negatives include no chills or sore throat. Past treatments include antibiotics (was prescribed Augmentin last week by another UC (note not in Epic); having to crush augmentin due to size and feels she may have lost some of the pill or that it was not as effective). The treatment provided no relief.     Problems: There are no active problems to display for this patient.   Allergies:  Allergies  Allergen Reactions   Hydrocodone-Acetaminophen Nausea And Vomiting    Vicodin ONLY not Hydro-Acet    Vicodin [Hydrocodone-Acetaminophen]     PT states she is not allergic to this medication    Medications:  Current Outpatient Medications:    cetirizine (ZYRTEC) 10 MG tablet, Take 1 tablet (10 mg total) by mouth daily., Disp: 30 tablet, Rfl: 11   doxycycline (VIBRA-TABS) 100 MG tablet, Take 1 tablet (100 mg total) by mouth 2 (  two) times daily., Disp: 20 tablet, Rfl: 0   ibuprofen (IBU) 400 MG tablet, Take 1 tablet (400 mg total) by mouth every 6 (six) hours as needed., Disp: 20 tablet, Rfl: 0   levothyroxine (SYNTHROID, LEVOTHROID) 125 MCG tablet, Take 1 tablet (125 mcg total) by mouth daily before breakfast. (Patient taking differently: Take 125 mcg by mouth daily before breakfast.), Disp: 30 tablet, Rfl: 0   methocarbamol  (ROBAXIN) 500 MG tablet, Take 1 tablet (500 mg total) by mouth 2 (two) times daily., Disp: 10 tablet, Rfl: 0  Observations/Objective: Patient is well-developed, well-nourished in no acute distress.  Resting comfortably at home.  Head is normocephalic, atraumatic.  No labored breathing.  Speech is clear and coherent with logical content.  Patient is alert and oriented at baseline.    Assessment and Plan: 1. Acute bacterial sinusitis (Primary) - doxycycline (VIBRA-TABS) 100 MG tablet; Take 1 tablet (100 mg total) by mouth 2 (two) times daily.  Dispense: 20 tablet; Refill: 0  2. Seasonal allergic rhinitis due to pollen - cetirizine (ZYRTEC) 10 MG tablet; Take 1 tablet (10 mg total) by mouth daily.  Dispense: 30 tablet; Refill: 11  - Worsening symptoms that have not responded to OTC medications.  - Will give Doxycycline - Cetirizine added for seasonal allergy component - Steam and humidifier can help - Stay well hydrated and get plenty of rest.  - Seek in person evaluation if no symptom improvement or if symptoms worsen   Follow Up Instructions: I discussed the assessment and treatment plan with the patient. The patient was provided an opportunity to ask questions and all were answered. The patient agreed with the plan and demonstrated an understanding of the instructions.  A copy of instructions were sent to the patient via MyChart unless otherwise noted below.    The patient was advised to call back or seek an in-person evaluation if the symptoms worsen or if the condition fails to improve as anticipated.    Margaretann Loveless, PA-C
